# Patient Record
Sex: Male | Born: 1965 | Race: White | Hispanic: No | Marital: Married
Health system: Midwestern US, Community
[De-identification: ages and names within clinical notes are randomized; demographics above are authoritative.]

## PROBLEM LIST (undated history)

## (undated) DIAGNOSIS — Z87891 Personal history of nicotine dependence: Secondary | ICD-10-CM

## (undated) DIAGNOSIS — R569 Unspecified convulsions: Secondary | ICD-10-CM

## (undated) DIAGNOSIS — N289 Disorder of kidney and ureter, unspecified: Secondary | ICD-10-CM

## (undated) HISTORY — DX: Unspecified convulsions: R56.9

## (undated) HISTORY — DX: Personal history of nicotine dependence: Z87.891

## (undated) HISTORY — PX: JOINT REPLACEMENT: SHX530

---

## 2003-04-17 ENCOUNTER — Encounter: Admission: RE | Admit: 2003-04-17 | Discharge: 2003-04-17 | Payer: Self-pay | Admitting: Family Medicine

## 2003-04-17 ENCOUNTER — Encounter: Payer: Self-pay | Admitting: Family Medicine

## 2008-06-13 ENCOUNTER — Ambulatory Visit: Payer: Self-pay | Admitting: Family Medicine

## 2009-06-12 ENCOUNTER — Ambulatory Visit: Payer: Self-pay | Admitting: Family Medicine

## 2009-06-12 ENCOUNTER — Encounter: Admission: RE | Admit: 2009-06-12 | Discharge: 2009-06-12 | Payer: Self-pay | Admitting: Family Medicine

## 2010-01-01 ENCOUNTER — Ambulatory Visit: Payer: Self-pay | Admitting: Family Medicine

## 2010-01-29 ENCOUNTER — Ambulatory Visit: Payer: Self-pay | Admitting: Family Medicine

## 2010-02-27 ENCOUNTER — Ambulatory Visit: Payer: Self-pay | Admitting: Family Medicine

## 2010-03-16 ENCOUNTER — Ambulatory Visit: Payer: Self-pay | Admitting: Family Medicine

## 2010-09-11 ENCOUNTER — Ambulatory Visit (INDEPENDENT_AMBULATORY_CARE_PROVIDER_SITE_OTHER): Payer: 59 | Admitting: Family Medicine

## 2010-09-11 DIAGNOSIS — R51 Headache: Secondary | ICD-10-CM

## 2010-09-11 DIAGNOSIS — J0100 Acute maxillary sinusitis, unspecified: Secondary | ICD-10-CM

## 2010-09-29 ENCOUNTER — Institutional Professional Consult (permissible substitution) (INDEPENDENT_AMBULATORY_CARE_PROVIDER_SITE_OTHER): Payer: 59 | Admitting: Family Medicine

## 2010-09-29 DIAGNOSIS — Z7189 Other specified counseling: Secondary | ICD-10-CM

## 2010-10-30 ENCOUNTER — Institutional Professional Consult (permissible substitution) (INDEPENDENT_AMBULATORY_CARE_PROVIDER_SITE_OTHER): Payer: 59 | Admitting: Family Medicine

## 2010-10-30 DIAGNOSIS — F341 Dysthymic disorder: Secondary | ICD-10-CM

## 2010-10-30 DIAGNOSIS — Z7189 Other specified counseling: Secondary | ICD-10-CM

## 2011-02-19 ENCOUNTER — Ambulatory Visit (INDEPENDENT_AMBULATORY_CARE_PROVIDER_SITE_OTHER): Payer: 59 | Admitting: Medical

## 2011-02-19 ENCOUNTER — Encounter: Payer: Self-pay | Admitting: Medical

## 2011-02-19 VITALS — BP 110/70 | HR 57 | Wt 171.0 lb

## 2011-02-19 DIAGNOSIS — H60399 Other infective otitis externa, unspecified ear: Secondary | ICD-10-CM

## 2011-02-19 DIAGNOSIS — H6090 Unspecified otitis externa, unspecified ear: Secondary | ICD-10-CM

## 2011-02-19 DIAGNOSIS — M5430 Sciatica, unspecified side: Secondary | ICD-10-CM

## 2011-02-19 MED ORDER — NAPROXEN 500 MG PO TABS: 500.0000 mg | ORAL_TABLET | Freq: Two times a day (BID) | ORAL | Status: DC

## 2011-02-19 MED ORDER — TRAMADOL HCL 50 MG PO TABS: 50.0000 mg | ORAL_TABLET | Freq: Three times a day (TID) | ORAL | Status: DC | PRN

## 2011-02-19 MED ORDER — CIPROFLOXACIN-DEXAMETHASONE 0.3-0.1 % OT SUSP: 4.0000 [drp] | Freq: Two times a day (BID) | OTIC | Status: AC

## 2011-02-19 NOTE — Patient Instructions (Signed)
Lumbar Radiculopathy, Sciatica Sciatica is a weakness and/or changes in sensation (tingling, jolts, hot and cold, numbness) along the path the sciatic nerve travels. Irritation or damage to lumbar nerve roots is often also referred to as lumbar radiculopathy.  Lumbar radiculopathy (Sciatica) is the most common form of this problem. Radiculopathy can occur in any of the nerves coming out of the spinal cord. The problems caused depend on which nerves are involved. The sciatic nerve is the large nerve supplying the branches of nerves going from the hip to the toes. It often causes a numbness or weakness in the skin and/or muscles that the sciatic nerve serves. It also may cause symptoms (problems) of pain, burning, tingling, or electric shock-like feelings in the path of this nerve. This usually comes from injury to the fibers that make up the sciatic nerve. Some of these symptoms are low back pain and/or unpleasant feelings in the following areas:  From the mid-buttock down the back of the leg to the back of the knee.   And/or the outside of the calf and top of the foot.   And/or behind the inner ankle to the sole of the foot.  CAUSES  Herniated or slipped disc. Discs are the little cushions between the bones in the back.   Pressure by the piriformis muscle in the buttock on the sciatic nerve (Piriformis Syndrome).   Misalignment of the bones in the lower back and buttocks (Sacroiliac Joint Derangement).   Narrowing of the spinal canal that puts pressure on or pinches the fibers that make up the sciatic nerve.   A slipped vertebra that is out of line with those above or beneath it.   Abnormality of the nervous system itself so that nerve fibers do not transmit signals properly, especially to feet and calves (neuropathy).   Tumor (this is rare).  Your caregiver can usually determine the cause of your sciatica and begin the treatment most likely to help you. TREATMENT Taking over-the-counter  painkillers, physical therapy, rest, exercise, spinal manipulation, and injections of anesthetics and/or steroids may be used. Surgery, acupuncture, and Yoga can also be effective. Mind over matter techniques, mental imagery, and changing factors such as your bed, chair, desk height, posture, and activities are other treatments that may be helpful. You and your caregiver can help determine what is best for you. With proper diagnosis, the cause of most sciatica can be identified and removed. Communication and cooperation between your caregiver and you is essential. If you are not successful immediately, do not be discouraged. With time, a proper treatment can be found that will make you comfortable. HOME CARE INSTRUCTIONS  If the pain is coming from a problem in the back, applying ice to that area for 20 minutes, 3 times per day while awake, may be helpful. Put the ice in a plastic bag. Place a towel between the bag of ice and your skin.   You may exercise or perform your usual activities if these do not aggravate your pain, or as suggested by your caregiver.   Only take over-the-counter or prescription medicines for pain, discomfort, or fever as directed by your caregiver.   If your caregiver has given you a follow-up appointment, it is very important to keep that appointment. Not keeping the appointment could result in a chronic or permanent injury, pain, and disability. If there is any problem keeping the appointment, you must call back to this facility for assistance.  SEEK IMMEDIATE MEDICAL CARE IF:  You experience loss  of control of bowel or bladder.   You have increasing weakness in the trunk, buttocks, or legs.   There is numbness in any areas from the hip down to the toes.   You have difficulty walking or keeping your balance.   You have any of the above, with fever or forceful vomiting.  Document Released: 07/13/2001 Document Re-Released: 08/10/2009 [facility] Patient Information 2011  [place], [place].

## 2011-02-19 NOTE — Progress Notes (Signed)
  Subjective:   HPI  Colin Roth is a 45 y.o. male who presents for 2 problems - ears and back.   He notes that his ear canal is swollen, gets some draining, x 38mo.  Had some Doxycycline left over, and he used this for 10 days on 2 separate occasions.  The ears would clear up and then symptoms would start again.  Ears are painful.  He denies recent swimming.  He denies hx/o recurrent ear problems.  He notes some sciatic issues.  He reports pain in left lower back that goes down his leg, hurts after long car ride, and hurts worse at night.  Has to sleep propped up at an unusual angle to get relief.  Up and down ladder worsens.  During the middle of the day when he is most active helps.   Been having this problem for over a year.  Has seen Dr. Susann Givens for this in the past.  Was advised some PT, but he was unable to do this due to being out of town often with PT.  No other aggravating or relieving factors.  No other c/o.  The following portions of the patient's history were reviewed and updated as appropriate: allergies, current medications, past family history, past medical history, past social history, past surgical history and problem list.  Past Medical History  Diagnosis Date  . Migraine   . Diabetes mellitus     type I  . Former smoker     Review of Systems Constitutional: denies fever, chills, sweats, unexpected weight change, anorexia, fatigue Dermatology: denies rash ENT: +bilat ear drainage and pain; no runny nose, sore throat, hoarseness, sinus pain Cardiology: denies chest pain, palpitations, edema Respiratory: denies cough, shortness of breath, wheezing Gastroenterology: denies abdominal pain, nausea, vomiting, diarrhea, constipation, blood in stool Musculoskeletal: denies arthralgias, myalgias, joint swelling Urology: denies dysuria, difficulty urinating, hematuria, urinary frequency, urgency Neurology: no headache, weakness, tingling, numbness     Objective:   Physical Exam  General appearance: alert, no distress, WD/WN, white male, lean HEENT: normocephalic, sclerae anicteric, bilat ear canals with swelling and purulent debris, also significant cerumen on the left, nares patent, no discharge or erythema, pharynx normal Oral cavity: MMM, no lesions Neck: supple, shoddy anterior and post auricular lymphadenopathy, no thyromegaly, no masses Heart: RRR, normal S1, S2, no murmurs Lungs: CTA bilaterally, no wheezes, rhonchi, or rales Abdomen: +bs, soft, non tender, non distended, no masses, no hepatomegaly, no splenomegaly Back: tender left lumbar region and sciatic region, pain with left SLR above 30 degrees, ROM with some left lumbar pain, but full, otherwise back nontender Musculoskeletal: LE nontender, no swelling, no obvious deformity Extremities: no edema, no cyanosis, no clubbing Pulses: 2+ symmetrical Neurological: LE strength and sensation normal   Assessment :    Encounter Diagnoses  Name Primary?  . Otitis externa Yes  . Sciatica      Plan:  Otitis externa - Ciprodex drops.  If not improving by mid next week, we can call out Augmetin.  Avoid swimming until improved.  Sciatica - discussed symptoms, etiology, and treatment options.  We will refer to PT, he can use Naprosyn daily for now, and Ultram for breakthrough pain.  Rx sent.  Recheck in 24mo.

## 2011-03-18 ENCOUNTER — Encounter: Payer: Self-pay | Admitting: Family Medicine

## 2011-03-19 ENCOUNTER — Encounter: Payer: Self-pay | Admitting: Medical

## 2011-03-19 ENCOUNTER — Ambulatory Visit (INDEPENDENT_AMBULATORY_CARE_PROVIDER_SITE_OTHER): Payer: 59 | Admitting: Medical

## 2011-03-19 VITALS — BP 112/72 | HR 72 | Temp 98.6°F | Resp 20 | Ht 72.0 in | Wt 176.0 lb

## 2011-03-19 DIAGNOSIS — H60399 Other infective otitis externa, unspecified ear: Secondary | ICD-10-CM

## 2011-03-19 DIAGNOSIS — M549 Dorsalgia, unspecified: Secondary | ICD-10-CM

## 2011-03-19 DIAGNOSIS — M5430 Sciatica, unspecified side: Secondary | ICD-10-CM

## 2011-03-19 DIAGNOSIS — H6090 Unspecified otitis externa, unspecified ear: Secondary | ICD-10-CM

## 2011-03-19 MED ORDER — TIZANIDINE HCL 4 MG PO TABS: 4.0000 mg | ORAL_TABLET | Freq: Every day | ORAL | Status: DC

## 2011-03-19 NOTE — Progress Notes (Signed)
  Subjective:   HPI  Colin Roth is a 44 y.o. male who presents for recheck on ears and back.  I saw him 02/19/11 for concern for sciatica and otitis externa.  He notes that his ears feel fine, no additional pain or drainage.   He is here for recheck on back pain.  Since last visit he has used the Naprosyn daily, Ultram some, and has went to 1 physical therapy session.  He is out of town several days a week on business.  Is up on ladders often.  Works as Engineer, agricultural.  He reports pain in left lower back that goes down his leg, hurts after long car ride, and hurts worse at night.  Has to sleep propped up at an unusual angle to get relief.  Up and down ladder worsens.  During the middle of the day when he is most active helps.   Been having this problem for over a year.  No other aggravating or relieving factors.  No other c/o.  He has seen his endocrinologist recently in regular f/u.  She cautioned his use of the Naprosyn.   His diabetes was apparently improved.   The following portions of the patient's history were reviewed and updated as appropriate: allergies, current medications, past family history, past medical history, past social history, past surgical history and problem list.  Past Medical History  Diagnosis Date  . Migraine   . Diabetes mellitus     type I  . Former smoker     Review of Systems Constitutional: denies fever, chills, sweats, unexpected weight change, anorexia, fatigue Dermatology: denies rash ENT: no runny nose, ear pain, sore throat, hoarseness, sinus pain Cardiology: denies chest pain, palpitations, edema Respiratory: denies cough, shortness of breath, wheezing Gastroenterology: denies abdominal pain, nausea, vomiting, diarrhea, constipation, blood in stool Musculoskeletal: denies arthralgias, myalgias, joint swelling Urology: denies dysuria, difficulty urinating, hematuria, urinary frequency, urgency Neurology: no headache, weakness,  tingling, numbness     Objective:   Physical Exam  General appearance: alert, no distress, WD/WN, white male, lean HEENT: normocephalic, sclerae anicteric, TMs pearly, bilat ear canals clear, nares patent, no discharge or erythema, pharynx normal Oral cavity: MMM, no lesions Neck: supple, no lymphadenopathy, no thyromegaly, no masses Heart: RRR, normal S1, S2, no murmurs Lungs: CTA bilaterally, no wheezes, rhonchi, or rales Abdomen: +bs, soft, non tender, non distended, no masses, no hepatomegaly, no splenomegaly Back: tender left lumbar region and sciatic region, pain with left SLR above 30 degrees, ROM with some left lumbar pain, but full, otherwise back nontender Musculoskeletal: LE nontender, no swelling, no obvious deformity Extremities: no edema, no cyanosis, no clubbing Pulses: 2+ symmetrical Neurological: LE strength and sensation normal   Assessment :    Encounter Diagnoses  Name Primary?  . Sciatica   . Back pain   . Otitis externa Yes     Plan:  Sciatica and back pain -he has only been to one physical therapy session.  He has no new neurological findings today.  I advised he continue doing the stretches that physical therapy showed him, follow up with therapy for least the next 3-4 weeks as his schedule may only allow one visit per week, continue Naprosyn for the time being, Ultram for breakthrough pain, and added tizanidine when necessary use just at bedtime.  Naprosyn will be for short-term use given his diabetes.   Call report in 3-4 weeks.   Otitis externa - resolved

## 2011-04-26 ENCOUNTER — Ambulatory Visit (INDEPENDENT_AMBULATORY_CARE_PROVIDER_SITE_OTHER): Payer: 59 | Admitting: Family Medicine

## 2011-04-26 ENCOUNTER — Encounter: Payer: Self-pay | Admitting: Family Medicine

## 2011-04-26 VITALS — BP 112/76 | HR 80 | Ht 72.0 in | Wt 179.0 lb

## 2011-04-26 DIAGNOSIS — M461 Sacroiliitis, not elsewhere classified: Secondary | ICD-10-CM

## 2011-04-26 DIAGNOSIS — Z23 Encounter for immunization: Secondary | ICD-10-CM

## 2011-04-26 DIAGNOSIS — G43909 Migraine, unspecified, not intractable, without status migrainosus: Secondary | ICD-10-CM

## 2011-04-26 DIAGNOSIS — H60399 Other infective otitis externa, unspecified ear: Secondary | ICD-10-CM

## 2011-04-26 DIAGNOSIS — H6091 Unspecified otitis externa, right ear: Secondary | ICD-10-CM

## 2011-04-26 MED ORDER — RIZATRIPTAN BENZOATE 10 MG PO TABS: 10.0000 mg | ORAL_TABLET | ORAL | Status: DC | PRN

## 2011-04-26 MED ORDER — CIPROFLOXACIN HCL 500 MG PO TABS: 500.0000 mg | ORAL_TABLET | Freq: Two times a day (BID) | ORAL | Status: AC

## 2011-04-26 NOTE — Patient Instructions (Addendum)
Cipro for your her pain. If you continue to have difficulty, return here for further care. Use the Maxalt for the headache and if this does not clear up, call me Continue with her exercises. We will refer you to orthopedics for followup on your back pain. Continue to work on encouraging her son to move on with his life.

## 2011-04-26 NOTE — Progress Notes (Signed)
  Subjective:    Patient ID: Colin Roth, male    DOB: [birth year], 45 y.o.   MRN: [id number]  HPI He is here for recheck. He has continued to have intermittent earache. It does respond to his proximal but inevitably will come back. No fever, chills or sore throat. He has a history of migraine headaches and states that he has had 5 days in a row . He normally gets one headache per month and uses over-the-counter medications. He has used Maxalt in the past with good results. He has a year and a half history of left-sided low back pain with some radiation. He states that the pain gets better during the day when he's up and moving and loosened up. No numbness tingling or weakness. He has been doing stretching and using Naprosyn however the pain persists. He also continues to deal with home life stress dealing with his wife and her recent illnesses as well as his son and his inability to move on with his life.  Review of Systems Negative except as above    Objective:   Physical Exam alert and in no distress. Right TM is dull and vascular, left is normal, canals are normal. Throat is clear. Tonsils are normal. Neck is supple without adenopathy or thyromegaly. Cardiac exam shows a regular sinus rhythm without murmurs or gallops. Lungs are clear to auscultation.  Alert and in no distress. Some tenderness to palpation over the left SI joint. Figure 4 test is positive. Stork test negative. Negative straight leg raising with normal DTRs.      Assessment & Plan:   1. Right otitis externa    2. Migraine headache    3. Sacroiliitis  Ambulatory referral to Orthopedic Surgery, DG Lumbar Spine 2-3 Views   Maxalt given for the migraine. He was encouraged to call me if he is not clear up. He will be referred to orthopedics for sacroiliitis since he continues to have difficulty on responsive to conservative care. Cipro will be given for his otitis Flu shot given

## 2011-04-29 ENCOUNTER — Ambulatory Visit
Admission: RE | Admit: 2011-04-29 | Discharge: 2011-04-29 | Disposition: A | Discharge status: Home / Self Care | Payer: 59 | Source: Ambulatory Visit | Attending: Family Medicine | Admitting: Family Medicine

## 2011-04-29 DIAGNOSIS — M461 Sacroiliitis, not elsewhere classified: Secondary | ICD-10-CM

## 2011-05-17 ENCOUNTER — Ambulatory Visit
Admission: RE | Admit: 2011-05-17 | Discharge: 2011-05-17 | Disposition: A | Discharge status: Home / Self Care | Payer: 59 | Source: Ambulatory Visit | Attending: Cardiology | Admitting: Cardiology

## 2011-05-17 ENCOUNTER — Other Ambulatory Visit: Payer: Self-pay | Admitting: Cardiology

## 2011-05-17 DIAGNOSIS — R0602 Shortness of breath: Secondary | ICD-10-CM

## 2011-06-08 ENCOUNTER — Other Ambulatory Visit: Payer: Self-pay

## 2011-06-08 ENCOUNTER — Encounter (HOSPITAL_COMMUNITY): Payer: Self-pay | Admitting: *Deleted

## 2011-06-08 ENCOUNTER — Inpatient Hospital Stay (HOSPITAL_COMMUNITY)
Admission: EM | Admit: 2011-06-08 | Discharge: 2011-06-10 | DRG: 638 | Disposition: A | Discharge status: Home / Self Care | Payer: 59 | Attending: Internal Medicine | Admitting: Internal Medicine

## 2011-06-08 DIAGNOSIS — Z9119 Patient's noncompliance with other medical treatment and regimen: Secondary | ICD-10-CM

## 2011-06-08 DIAGNOSIS — N179 Acute kidney failure, unspecified: Secondary | ICD-10-CM | POA: Diagnosis present

## 2011-06-08 DIAGNOSIS — E1010 Type 1 diabetes mellitus with ketoacidosis without coma: Secondary | ICD-10-CM | POA: Diagnosis present

## 2011-06-08 DIAGNOSIS — Z87891 Personal history of nicotine dependence: Secondary | ICD-10-CM

## 2011-06-08 DIAGNOSIS — K219 Gastro-esophageal reflux disease without esophagitis: Secondary | ICD-10-CM | POA: Diagnosis present

## 2011-06-08 DIAGNOSIS — E860 Dehydration: Secondary | ICD-10-CM | POA: Diagnosis present

## 2011-06-08 DIAGNOSIS — E119 Type 2 diabetes mellitus without complications: Secondary | ICD-10-CM | POA: Diagnosis present

## 2011-06-08 DIAGNOSIS — D72829 Elevated white blood cell count, unspecified: Secondary | ICD-10-CM | POA: Diagnosis present

## 2011-06-08 DIAGNOSIS — Z794 Long term (current) use of insulin: Secondary | ICD-10-CM

## 2011-06-08 DIAGNOSIS — E1310 Other specified diabetes mellitus with ketoacidosis without coma: Principal | ICD-10-CM | POA: Diagnosis present

## 2011-06-08 DIAGNOSIS — Z96619 Presence of unspecified artificial shoulder joint: Secondary | ICD-10-CM

## 2011-06-08 DIAGNOSIS — Z91199 Patient's noncompliance with other medical treatment and regimen due to unspecified reason: Secondary | ICD-10-CM

## 2011-06-08 DIAGNOSIS — E1110 Type 2 diabetes mellitus with ketoacidosis without coma: Secondary | ICD-10-CM | POA: Diagnosis present

## 2011-06-08 DIAGNOSIS — Z79899 Other long term (current) drug therapy: Secondary | ICD-10-CM

## 2011-06-08 DIAGNOSIS — E109 Type 1 diabetes mellitus without complications: Secondary | ICD-10-CM | POA: Diagnosis present

## 2011-06-08 LAB — GLUCOSE, CAPILLARY
Glucose-Capillary: >600 mg/dL (ref 70–99)
Glucose-Capillary: >600 mg/dL (ref 70–99)
Glucose-Capillary: 440 mg/dL — ABNORMAL HIGH (ref 70–99)
Glucose-Capillary: 425 mg/dL — ABNORMAL HIGH (ref 70–99)
Glucose-Capillary: 203 mg/dL — ABNORMAL HIGH (ref 70–99)
Glucose-Capillary: 205 mg/dL — ABNORMAL HIGH (ref 70–99)

## 2011-06-08 LAB — DIFFERENTIAL
Basophils Relative: 0 % (ref 0–1)
Eosinophils Absolute: 0 10*3/uL (ref 0.0–0.7)
Lymphocytes Relative: 9 % — ABNORMAL LOW (ref 12–46)
Monocytes Absolute: 2.5 10*3/uL — ABNORMAL HIGH (ref 0.1–1.0)
Neutrophils Relative %: 80 % — ABNORMAL HIGH (ref 43–77)

## 2011-06-08 LAB — POCT I-STAT 3, VENOUS BLOOD GAS (G3P V)
Acid-base deficit: 23 mmol/L — ABNORMAL HIGH (ref 0.0–2.0)
O2 Saturation: 82 %
Patient temperature: 37
TCO2: 7 mmol/L (ref 0–100)
pCO2, Ven: 23.2 mmHg — ABNORMAL LOW (ref 45.0–50.0)

## 2011-06-08 LAB — POCT I-STAT, CHEM 8
Calcium, Ion: 1.08 mmol/L — ABNORMAL LOW (ref 1.12–1.32)
Glucose, Bld: >700 mg/dL (ref 70–99)
HCT: 54 % — ABNORMAL HIGH (ref 39.0–52.0)
Hemoglobin: 18.4 g/dL — ABNORMAL HIGH (ref 13.0–17.0)
TCO2: 6 mmol/L (ref 0–100)

## 2011-06-08 LAB — MAGNESIUM: Magnesium: 2 mg/dL (ref 1.5–2.5)

## 2011-06-08 LAB — BASIC METABOLIC PANEL
BUN: 32 mg/dL — ABNORMAL HIGH (ref 6–23)
CO2: 12 mEq/L — ABNORMAL LOW (ref 19–32)
CO2: 22 mEq/L (ref 19–32)
Calcium: 9.3 mg/dL (ref 8.4–10.5)
Calcium: 9.2 mg/dL (ref 8.4–10.5)
Chloride: 96 mEq/L (ref 96–112)
Creatinine, Ser: 1.3 mg/dL (ref 0.50–1.35)
GFR calc non Af Amer: 87 mL/min — ABNORMAL LOW (ref >90)
Glucose, Bld: 424 mg/dL — ABNORMAL HIGH (ref 70–99)
Glucose, Bld: 188 mg/dL — ABNORMAL HIGH (ref 70–99)
Sodium: 136 mEq/L (ref 135–145)

## 2011-06-08 LAB — CBC
HCT: 45.4 % (ref 39.0–52.0)
Hemoglobin: 17 g/dL (ref 13.0–17.0)
Hemoglobin: 16.4 g/dL (ref 13.0–17.0)
MCH: 32.5 pg (ref 26.0–34.0)
MCHC: 33.9 g/dL (ref 30.0–36.0)
MCV: 90.1 fL (ref 78.0–100.0)
Platelets: 284 10*3/uL (ref 150–400)
RBC: 5.21 MIL/uL (ref 4.22–5.81)
RBC: 5.04 MIL/uL (ref 4.22–5.81)

## 2011-06-08 LAB — CREATININE, SERUM: Creatinine, Ser: 1.42 mg/dL — ABNORMAL HIGH (ref 0.50–1.35)

## 2011-06-08 MED ORDER — POTASSIUM CHLORIDE 20 MEQ PO PACK
40.0000 meq | PACK | Freq: Once | ORAL | Status: DC
  Filled 2011-06-08: qty 2

## 2011-06-08 MED ORDER — POTASSIUM CHLORIDE 20 MEQ/15ML (10%) PO LIQD
40.0000 meq | Freq: Every day | ORAL | Status: DC
  Administered 2011-06-09 – 2011-06-10 (×2): 40 meq via ORAL
  Filled 2011-06-08 (×2): qty 30

## 2011-06-08 MED ORDER — SODIUM CHLORIDE 0.9 % IV BOLUS (SEPSIS)
2000.0000 mL | Freq: Once | INTRAVENOUS | Status: AC
  Administered 2011-06-08: 2000 mL via INTRAVENOUS

## 2011-06-08 MED ORDER — POTASSIUM CHLORIDE 10 MEQ/100ML IV SOLN
10.0000 meq | INTRAVENOUS | Status: AC
  Administered 2011-06-08: 10 meq via INTRAVENOUS

## 2011-06-08 MED ORDER — INSULIN ASPART 100 UNIT/ML ~~LOC~~ SOLN
3.0000 [IU] | Freq: Three times a day (TID) | SUBCUTANEOUS | Status: DC
  Filled 2011-06-08: qty 3

## 2011-06-08 MED ORDER — ALUM & MAG HYDROXIDE-SIMETH 200-200-20 MG/5ML PO SUSP
30.0000 mL | Freq: Four times a day (QID) | ORAL | Status: DC | PRN
  Administered 2011-06-09 – 2011-06-10 (×3): 30 mL via ORAL
  Filled 2011-06-08 (×3): qty 30

## 2011-06-08 MED ORDER — SODIUM CHLORIDE 0.9 % IV SOLN
INTRAVENOUS | Status: DC
  Administered 2011-06-08: 13:00:00 via INTRAVENOUS

## 2011-06-08 MED ORDER — OXYCODONE HCL 5 MG PO TABS
5.0000 mg | ORAL_TABLET | ORAL | Status: DC | PRN
  Administered 2011-06-08 – 2011-06-10 (×6): 5 mg via ORAL
  Filled 2011-06-08 (×6): qty 1

## 2011-06-08 MED ORDER — ENOXAPARIN SODIUM 40 MG/0.4ML ~~LOC~~ SOLN
40.0000 mg | SUBCUTANEOUS | Status: DC
  Administered 2011-06-10: 40 mg via SUBCUTANEOUS
  Filled 2011-06-08 (×2): qty 0.4

## 2011-06-08 MED ORDER — ACETAMINOPHEN 325 MG PO TABS: 650.0000 mg | ORAL_TABLET | Freq: Four times a day (QID) | ORAL | Status: DC | PRN

## 2011-06-08 MED ORDER — INSULIN GLARGINE 100 UNIT/ML ~~LOC~~ SOLN
55.0000 [IU] | Freq: Every day | SUBCUTANEOUS | Status: DC
  Administered 2011-06-08: 55 [IU] via SUBCUTANEOUS
  Filled 2011-06-08: qty 3

## 2011-06-08 MED ORDER — DEXTROSE-NACL 5-0.45 % IV SOLN
INTRAVENOUS | Status: DC
  Administered 2011-06-08: 15:00:00 via INTRAVENOUS

## 2011-06-08 MED ORDER — METOCLOPRAMIDE HCL 5 MG/ML IJ SOLN: 10.0000 mg | Freq: Once | INTRAMUSCULAR | Status: DC

## 2011-06-08 MED ORDER — SODIUM CHLORIDE 0.9 % IV SOLN
INTRAVENOUS | Status: DC
  Filled 2011-06-08: qty 1

## 2011-06-08 MED ORDER — ENOXAPARIN SODIUM 40 MG/0.4ML ~~LOC~~ SOLN
40.0000 mg | SUBCUTANEOUS | Status: DC
  Administered 2011-06-08 – 2011-06-09 (×2): 40 mg via SUBCUTANEOUS
  Filled 2011-06-08 (×3): qty 0.4

## 2011-06-08 MED ORDER — ENOXAPARIN SODIUM 40 MG/0.4ML ~~LOC~~ SOLN: 40.0000 mg | SUBCUTANEOUS | Status: DC

## 2011-06-08 MED ORDER — ACETAMINOPHEN 650 MG RE SUPP: 650.0000 mg | Freq: Four times a day (QID) | RECTAL | Status: DC | PRN

## 2011-06-08 MED ORDER — ONDANSETRON HCL 4 MG/2ML IJ SOLN: 4.0000 mg | Freq: Four times a day (QID) | INTRAMUSCULAR | Status: DC | PRN

## 2011-06-08 MED ORDER — SODIUM CHLORIDE 0.9 % IV SOLN: INTRAVENOUS | Status: DC

## 2011-06-08 MED ORDER — ZOLPIDEM TARTRATE 5 MG PO TABS: 10.0000 mg | ORAL_TABLET | Freq: Every evening | ORAL | Status: DC | PRN

## 2011-06-08 MED ORDER — SENNOSIDES-DOCUSATE SODIUM 8.6-50 MG PO TABS
1.0000 | ORAL_TABLET | Freq: Every day | ORAL | Status: DC | PRN
  Filled 2011-06-08: qty 1

## 2011-06-08 MED ORDER — ONDANSETRON HCL 4 MG/2ML IJ SOLN
4.0000 mg | Freq: Once | INTRAMUSCULAR | Status: AC
  Administered 2011-06-08: 4 mg via INTRAVENOUS
  Filled 2011-06-08: qty 2

## 2011-06-08 MED ORDER — DEXTROSE-NACL 5-0.45 % IV SOLN
INTRAVENOUS | Status: DC
  Administered 2011-06-08 (×2): via INTRAVENOUS

## 2011-06-08 MED ORDER — DEXTROSE 50 % IV SOLN: 25.0000 mL | INTRAVENOUS | Status: DC | PRN

## 2011-06-08 MED ORDER — ONDANSETRON HCL 4 MG PO TABS: 4.0000 mg | ORAL_TABLET | Freq: Four times a day (QID) | ORAL | Status: DC | PRN

## 2011-06-08 MED ORDER — VITAMIN B-1 100 MG PO TABS
100.0000 mg | ORAL_TABLET | Freq: Every day | ORAL | Status: DC
  Administered 2011-06-09 – 2011-06-10 (×2): 100 mg via ORAL
  Filled 2011-06-08 (×3): qty 1

## 2011-06-08 MED ORDER — SODIUM CHLORIDE 0.9 % IV SOLN
INTRAVENOUS | Status: DC
  Administered 2011-06-08: 5.4 [IU]/h via INTRAVENOUS
  Administered 2011-06-08: 10.8 [IU]/h via INTRAVENOUS
  Filled 2011-06-08: qty 1

## 2011-06-08 MED ORDER — ENOXAPARIN SODIUM 40 MG/0.4ML ~~LOC~~ SOLN
40.0000 mg | SUBCUTANEOUS | Status: DC
  Filled 2011-06-08: qty 0.4

## 2011-06-08 MED ORDER — INSULIN ASPART 100 UNIT/ML ~~LOC~~ SOLN: 0.0000 [IU] | Freq: Three times a day (TID) | SUBCUTANEOUS | Status: DC

## 2011-06-08 NOTE — ED Notes (Signed)
CBG performed and was 247 mg/dl.  Nurse notified.

## 2011-06-08 NOTE — ED Notes (Signed)
Family at bedside. 

## 2011-06-08 NOTE — ED Notes (Signed)
Dr Read Drivers at bedside.  Pt remains tachy 120-150-s. Nauseated, family at bedside.  Hydration inprogress

## 2011-06-08 NOTE — ED Notes (Signed)
Checked CBG on arrival, meter read HIGH, RN [employer] notified

## 2011-06-08 NOTE — ED Notes (Signed)
Report given to [place], RN on 3300.

## 2011-06-08 NOTE — ED Notes (Signed)
Pt c/o n/v since yesterday with increasing vomiting.  States vomited approx 30 times in past 24 hours.  Pt began to be confused and was brought to ED.  Pt tachypneic upon assessment with O2 sats 100% on RA.

## 2011-06-08 NOTE — ED Notes (Signed)
Patient spilled urine in bed while using a urinal.  Changed sheet, cleaned patient and new gown.

## 2011-06-08 NOTE — H&P (Signed)
Colin Roth MRN: [id number] DOB/AGE: 1965/12/01 45 y.o. Primary Care Physician:Colin Roth Colin Most, MD, MD Admit date: 06/08/2011 Chief Complaint: Short of breath and sick HPI: 45 year old man with type 1 diabetes mellitus, presenting to the emergency room with about 3 days worsening dyspnea nausea and vomiting. He has been having on-and-off shortness of breath for about couple weeks, and has had extensive cardiac investigations to look into possibility of unstable angina. Today finally his family brought into the emergency room he was found to be in diabetic ketoacidosis admitted to the hospital.   Past Medical History  Diagnosis Date  . Migraine   . Diabetes mellitus     type I  . Former smoker   . Asthma    Past Surgical History  Procedure Date  . Joint replacement     LEFT SHOULDER        Family History  Problem Relation Age of Onset  . Heart disease Maternal Grandfather   . Hypertension Paternal Grandmother     Social History:  reports that he quit smoking about 7 months ago. His smoking use included Cigarettes. He smoked 0 packs per day. He has never used smokeless tobacco. He reports that he drinks alcohol. He reports that he does not use illicit drugs.   Allergies: No Known Allergies  Medications Prior to Admission  Medication Dose Route Frequency Provider Last Rate Last Dose  . 0.9 %  sodium chloride infusion   Intravenous Continuous Colin Roth      . acetaminophen (TYLENOL) tablet 650 mg  650 mg Oral Q6H PRN Colin Roth       Or  . acetaminophen (TYLENOL) suppository 650 mg  650 mg Rectal Q6H PRN Colin Roth      . alum & mag hydroxide-simeth (MAALOX/MYLANTA) 200-200-20 MG/5ML suspension 30 mL  30 mL Oral Q6H PRN Colin Roth      . dextrose 5 %-0.45 % sodium chloride infusion   Intravenous Continuous Colin Roth      . dextrose 50 % solution 25 mL  25 mL Intravenous PRN Colin Roth      . enoxaparin (LOVENOX) injection 40 mg  40 mg Subcutaneous Q24H Colin P  Roth, PHARMD      . enoxaparin (LOVENOX) injection 40 mg  40 mg Subcutaneous To ER Colin Roth, PHARMD      . enoxaparin (LOVENOX) injection 40 mg  40 mg Subcutaneous Q24H Colin Roth      . insulin regular (HUMULIN R,NOVOLIN R) 1 Units/mL in sodium chloride 0.9 % 100 mL infusion   Intravenous Continuous Colin L Molpus, MD 15.2 mL/hr at 06/08/11 1005 15.2 Units/hr at 06/08/11 1005  . insulin regular (HUMULIN R,NOVOLIN R) 1 Units/mL in sodium chloride 0.9 % 100 mL infusion   Intravenous Continuous Colin Roth      . ondansetron (ZOFRAN) injection 4 mg  4 mg Intravenous Once Colin Beers Molpus, MD   4 mg at 06/08/11 0504  . ondansetron (ZOFRAN) tablet 4 mg  4 mg Oral Q6H PRN Colin Roth       Or  . ondansetron (ZOFRAN) injection 4 mg  4 mg Intravenous Q6H PRN Colin Roth      . oxyCODONE (Oxy IR/ROXICODONE) immediate release tablet 5 mg  5 mg Oral Q4H PRN Colin Roth      . potassium chloride 10 mEq in 100 mL IVPB  10 mEq Intravenous Q1H Colin Roth      . senna-docusate (Senokot-S) tablet 1 tablet  1 tablet Oral Daily  PRN Colin Roth      . sodium chloride 0.9 % bolus 2,000 mL  2,000 mL Intravenous Once Colin Beers Molpus, MD   2,000 mL at 06/08/11 0501  . vitamin B-1 tablet 100 mg  100 mg Oral Daily Colin Roth      . zolpidem (AMBIEN) tablet 10 mg  10 mg Oral QHS PRN Colin Roth      . DISCONTD: 0.9 %  sodium chloride infusion   Intravenous Continuous Colin Roth      . DISCONTD: enoxaparin (LOVENOX) injection 40 mg  40 mg Subcutaneous Q24H Colin Roth      . DISCONTD: metoCLOPramide (REGLAN) injection 10 mg  10 mg Intravenous Once Colin Seamen, MD       Medications Prior to Admission  Medication Sig Dispense Refill  . insulin glargine (LANTUS) 100 UNIT/ML injection Inject 55 Units into the skin at bedtime.       . insulin lispro (HUMALOG) 100 UNIT/ML injection Inject into the skin 3 (three) times daily before meals. Sliding scale       . metFORMIN (GLUCOPHAGE) 500 MG tablet Take 500 mg by mouth 2 (two) times  daily with a meal.             ZOX:WRUEA from the symptoms mentioned above,there are no other symptoms referable to all systems reviewed.  Physical Exam: Blood pressure 125/70, pulse 124, temperature 98.1 F (36.7 C), temperature source Oral, resp. rate 26, SpO2 98.00%. Patient is alert oriented in some mild distress. Anxious. Head normocephalic. Eyes pupils equal round react to light and accommodation. Heart tachycardic regular without murmurs lungs ago. Abdomen is soft, difuse tender, without rebound or guarding. Skin without any rashes. Neurological exam nonfocal.   Results for orders placed during the hospital encounter of 06/08/11 (from the past 48 hour(s))  GLUCOSE, CAPILLARY     Status: Abnormal   Collection Time   06/08/11  4:40 AM      Component Value Range Comment   Glucose-Capillary >600 (*) 70 - 99 (mg/dL)    Comment 1 Notify RN      Comment 2 Documented in Chart     CBC     Status: Abnormal   Collection Time   06/08/11  4:56 AM      Component Value Range Comment   WBC 23.1 (*) 4.0 - 10.5 (K/uL)    RBC 5.21  4.22 - 5.81 (MIL/uL)    Hemoglobin 17.0  13.0 - 17.0 (g/dL)    HCT 54.0  98.1 - 19.1 (%)    MCV 96.2  78.0 - 100.0 (fL)    MCH 32.6  26.0 - 34.0 (pg)    MCHC 33.9  30.0 - 36.0 (g/dL)    RDW 47.8  29.5 - 62.1 (%)    Platelets 284  150 - 400 (K/uL)   DIFFERENTIAL     Status: Abnormal   Collection Time   06/08/11  4:56 AM      Component Value Range Comment   Neutrophils Relative 80 (*) 43 - 77 (%)    Lymphocytes Relative 9 (*) 12 - 46 (%)    Monocytes Relative 11  3 - 12 (%)    Eosinophils Relative 0  0 - 5 (%)    Basophils Relative 0  0 - 1 (%)    Neutro Abs 18.5 (*) 1.7 - 7.7 (K/uL)    Lymphs Abs 2.1  0.7 - 4.0 (K/uL)    Monocytes Absolute 2.5 (*) 0.1 - 1.0 (  K/uL)    Eosinophils Absolute 0.0  0.0 - 0.7 (K/uL)    Basophils Absolute 0.0  0.0 - 0.1 (K/uL)    WBC Morphology MILD LEFT SHIFT (1-5% METAS, OCC MYELO, OCC BANDS)     GLUCOSE, CAPILLARY     Status:  Abnormal   Collection Time   06/08/11  6:24 AM      Component Value Range Comment   Glucose-Capillary >600 (*) 70 - 99 (mg/dL)    Comment 1 Documented in Chart      Comment 2 Notify RN     POCT I-STAT 3, BLOOD GAS (G3P V)     Status: Abnormal   Collection Time   06/08/11  6:47 AM      Component Value Range Comment   pH, Ven 7.030 (*) 7.250 - 7.300     pCO2, Ven 23.2 (*) 45.0 - 50.0 (mmHg)    pO2, Ven 66.0 (*) 30.0 - 45.0 (mmHg)    Bicarbonate 6.1 (*) 20.0 - 24.0 (mEq/L)    TCO2 7  0 - 100 (mmol/L)    O2 Saturation 82.0      Acid-base deficit 23.0 (*) 0.0 - 2.0 (mmol/L)    Patient temperature 37.0 C      Sample type VENOUS      Comment NOTIFIED PHYSICIAN     POCT I-STAT, CHEM 8     Status: Abnormal   Collection Time   06/08/11  6:50 AM      Component Value Range Comment   Sodium 126 (*) 135 - 145 (mEq/L)    Potassium 5.0  3.5 - 5.1 (mEq/L)    Chloride 95 (*) 96 - 112 (mEq/L)    BUN 40 (*) 6 - 23 (mg/dL)    Creatinine, Ser 1.61 (*) 0.50 - 1.35 (mg/dL)    Glucose, Bld >096 (*) 70 - 99 (mg/dL)    Calcium, Ion 0.45 (*) 1.12 - 1.32 (mmol/L)    TCO2 6  0 - 100 (mmol/L)    Hemoglobin 18.4 (*) 13.0 - 17.0 (g/dL)    HCT 40.9 (*) 81.1 - 52.0 (%)    Comment NOTIFIED PHYSICIAN     GLUCOSE, CAPILLARY     Status: Abnormal   Collection Time   06/08/11  7:45 AM      Component Value Range Comment   Glucose-Capillary >600 (*) 70 - 99 (mg/dL)   GLUCOSE, CAPILLARY     Status: Abnormal   Collection Time   06/08/11  8:51 AM      Component Value Range Comment   Glucose-Capillary 547 (*) 70 - 99 (mg/dL)    Comment 1 Notify RN     CBC     Status: Abnormal   Collection Time   06/08/11  9:18 AM      Component Value Range Comment   WBC 20.4 (*) 4.0 - 10.5 (K/uL)    RBC 5.04  4.22 - 5.81 (MIL/uL)    Hemoglobin 16.4  13.0 - 17.0 (g/dL)    HCT 91.4  78.2 - 95.6 (%)    MCV 90.1  78.0 - 100.0 (fL)    MCH 32.5  26.0 - 34.0 (pg)    MCHC 36.1 (*) 30.0 - 36.0 (g/dL)    RDW 21.3  08.6 - 57.8 (%)     Platelets 229  150 - 400 (K/uL)   CREATININE, SERUM     Status: Abnormal   Collection Time   06/08/11  9:18 AM      Component Value Range Comment  Creatinine, Ser 1.42 (*) 0.50 - 1.35 (mg/dL)    GFR calc non Af Amer 59 (*) >90 (mL/min)    GFR calc Af Amer 68 (*) >90 (mL/min)   GLUCOSE, CAPILLARY     Status: Abnormal   Collection Time   06/08/11 10:01 AM      Component Value Range Comment   Glucose-Capillary 440 (*) 70 - 99 (mg/dL)    No results found for this or any previous visit (from the past 240 hour(s)).  Dg Chest 2 View  05/17/2011  *RADIOLOGY REPORT*  Clinical Data: Shortness of breath. Former smoker.  CHEST - 2 VIEW  Comparison: None.  Findings: Cardiac and mediastinal contours appear normal.  The lungs appear clear.  No pleural effusion is identified.  IMPRESSION:  No significant abnormality identified.  Original Report Authenticated By: Dellia Cloud, M.D.   Impression: Diabetic ketoacidosis - due to noncompliant Principal Problem:  *DKA, type 1 Active Problems:  DM type 1, not at goal     Plan: Plan is to admit the patient to step down placement intravenous fluids and intravenous insulin. Start checking basic metabolic profile every 4 hours. Adjust insulin infusion rate and replace electrolytes as needed. Further plans of care will depend on he progresses. Patient also has acute renal failure due to dehydration related to the polyuria from uncontrolled diabetes. I suspect this will improve with IV fluids. The leukocytosis I suspect is related to diabetic ketoacidosis as the patient has no fever chills cough diarrhea or size of sepsis.       Aspynn Clover 06/08/2011, 10:42 AM

## 2011-06-08 NOTE — ED Notes (Signed)
Pt.bloodsugar is high over 600mg .

## 2011-06-08 NOTE — ED Provider Notes (Signed)
History     CSN: [id number] Arrival date & time: 06/08/2011  4:33 AM   First MD Initiated Contact with Patient 06/08/11 0454      Chief Complaint  Patient presents with  . Hyperglycemia    (Consider location/radiation/quality/duration/timing/severity/associated sxs/prior treatment) HPI This is a 45 year old white male with a history of type 1 diabetes. He developed nausea and vomiting 2 days ago which as persisted. He has had a polyuria, increased thirst (but unable to drink due to emesis) and shortness of breath. The symptoms are worsened and are now moderate to severe. He has not been able to take his regular medications nor has he checked his sugar when he was noted to be critical high in triage here. He was also noted to be profoundly tachycardic in triage. He states he has never been in DKA in the past.  Past Medical History  Diagnosis Date  . Migraine   . Diabetes mellitus     type I  . Former smoker   . Asthma     Past Surgical History  Procedure Date  . Joint replacement     LEFT SHOULDER    Family History  Problem Relation Age of Onset  . Heart disease Maternal Grandfather   . Hypertension Paternal Grandmother     History  Substance Use Topics  . Smoking status: Former Smoker -- 0.0 packs/day    Quit date: 11/01/2010  . Smokeless tobacco: Never Used  . Alcohol Use: Yes     occassional      Review of Systems  All other systems reviewed and are negative.    Allergies  Review of patient's allergies indicates no known allergies.  Home Medications   Current Outpatient Rx  Name Route Sig Dispense Refill  . INSULIN GLARGINE 100 UNIT/ML [place] SOLN Subcutaneous Inject 55 Units into the skin at bedtime.     . INSULIN LISPRO (HUMAN) 100 UNIT/ML [place] SOLN Subcutaneous Inject into the skin 3 (three) times daily before meals. Sliding scale     . METFORMIN HCL 500 MG PO TABS Oral Take 500 mg by mouth 2 (two) times daily with a meal.        BP 122/83  Pulse 122   Temp(Src) 97.3 F (36.3 C) (Axillary)  Resp 26  SpO2 100%  Physical Exam General: Well-developed, well-nourished male; somewhat lethargic; appearance consistent with age of record HENT: normocephalic, atraumatic; oral mucous membranes dry; breath smells of ketones Eyes: Normal appearance Neck: supple Heart: regular rate and rhythm; tachycardic Lungs: clear to auscultation bilaterally; no small respirations Abdomen: soft; nontender; nondistended; no masses or hepatosplenomegaly; bowel sounds present Extremities: No deformity; full range of motion Neurologic: Awake, alert;motor function intact in all extremities and symmetric; no facial droop Skin: Warm and dry     ED Course  CRITICAL CARE Performed by: Hanley Seamen Authorized by: Hanley Seamen Total critical care time: 30 minutes Critical care was necessary to treat or prevent imminent or life-threatening deterioration of the following conditions: endocrine crisis and metabolic crisis. Critical care was time spent personally by me on the following activities: development of treatment plan with patient or surrogate, discussions with consultants, evaluation of patient's response to treatment, examination of patient, obtaining history from patient or surrogate, ordering and performing treatments and interventions, ordering and review of laboratory studies and re-evaluation of patient's condition.   (including critical care time)    MDM   Nursing notes and vitals signs, including pulse oximetry, reviewed.  Summary of this  visit's results, reviewed by myself:  Labs:  Results for orders placed during the hospital encounter of 06/08/11  GLUCOSE, CAPILLARY      Component Value Range   Glucose-Capillary >600 (*) 70 - 99 (mg/dL)   Comment 1 Notify RN     Comment 2 Documented in Chart    CBC      Component Value Range   WBC 23.1 (*) 4.0 - 10.5 (K/uL)   RBC 5.21  4.22 - 5.81 (MIL/uL)   Hemoglobin 17.0  13.0 - 17.0 (g/dL)    HCT 19.1  47.8 - 29.5 (%)   MCV 96.2  78.0 - 100.0 (fL)   MCH 32.6  26.0 - 34.0 (pg)   MCHC 33.9  30.0 - 36.0 (g/dL)   RDW 62.1  30.8 - 65.7 (%)   Platelets 284  150 - 400 (K/uL)  DIFFERENTIAL      Component Value Range   Neutrophils Relative 80 (*) 43 - 77 (%)   Lymphocytes Relative 9 (*) 12 - 46 (%)   Monocytes Relative 11  3 - 12 (%)   Eosinophils Relative 0  0 - 5 (%)   Basophils Relative 0  0 - 1 (%)   Neutro Abs 18.5 (*) 1.7 - 7.7 (K/uL)   Lymphs Abs 2.1  0.7 - 4.0 (K/uL)   Monocytes Absolute 2.5 (*) 0.1 - 1.0 (K/uL)   Eosinophils Absolute 0.0  0.0 - 0.7 (K/uL)   Basophils Absolute 0.0  0.0 - 0.1 (K/uL)   WBC Morphology MILD LEFT SHIFT (1-5% METAS, OCC MYELO, OCC BANDS)    GLUCOSE, CAPILLARY      Component Value Range   Glucose-Capillary >600 (*) 70 - 99 (mg/dL)   Comment 1 Documented in Chart     Comment 2 Notify RN      6:44 AM Patient started on 2 L IV fluid bolus immediately after initial evaluation. Insulin drip her glucose stabilizer ordered.  I-STAT venous blood gas shows pH of 7.03 with the bicarbonate was 6.1; these are consistent with diabetic ketoacidosis.           Carlisle Beers Nason Conradt, MD 06/08/11 0700

## 2011-06-08 NOTE — ED Notes (Signed)
Obtained patients CBG it was 341 mg/dl

## 2011-06-08 NOTE — ED Notes (Signed)
Patient reports he developed shortness of breath, n/v and feeling bad on Saturday.  Patient noted to have labored/rapid resp in triage.  Patient states he has not been able to take his meds nor has he checked his sugar

## 2011-06-09 DIAGNOSIS — E860 Dehydration: Secondary | ICD-10-CM | POA: Diagnosis present

## 2011-06-09 DIAGNOSIS — E119 Type 2 diabetes mellitus without complications: Secondary | ICD-10-CM | POA: Diagnosis present

## 2011-06-09 DIAGNOSIS — N179 Acute kidney failure, unspecified: Secondary | ICD-10-CM | POA: Diagnosis present

## 2011-06-09 DIAGNOSIS — E1110 Type 2 diabetes mellitus with ketoacidosis without coma: Secondary | ICD-10-CM | POA: Diagnosis present

## 2011-06-09 DIAGNOSIS — D72829 Elevated white blood cell count, unspecified: Secondary | ICD-10-CM | POA: Diagnosis present

## 2011-06-09 LAB — GLUCOSE, CAPILLARY
Glucose-Capillary: 299 mg/dL — ABNORMAL HIGH (ref 70–99)
Glucose-Capillary: 319 mg/dL — ABNORMAL HIGH (ref 70–99)
Glucose-Capillary: 324 mg/dL — ABNORMAL HIGH (ref 70–99)
Glucose-Capillary: 345 mg/dL — ABNORMAL HIGH (ref 70–99)
Glucose-Capillary: 365 mg/dL — ABNORMAL HIGH (ref 70–99)

## 2011-06-09 LAB — BASIC METABOLIC PANEL
BUN: 21 mg/dL (ref 6–23)
Calcium: 9.2 mg/dL (ref 8.4–10.5)
Creatinine, Ser: 0.69 mg/dL (ref 0.50–1.35)
GFR calc Af Amer: >90 mL/min (ref >90)
GFR calc non Af Amer: >90 mL/min (ref >90)

## 2011-06-09 LAB — CBC
MCHC: 36.4 g/dL — ABNORMAL HIGH (ref 30.0–36.0)
RDW: 13 % (ref 11.5–15.5)

## 2011-06-09 LAB — MRSA PCR SCREENING: MRSA by PCR: POSITIVE — AB

## 2011-06-09 MED ORDER — INSULIN ASPART 100 UNIT/ML ~~LOC~~ SOLN
10.0000 [IU] | Freq: Three times a day (TID) | SUBCUTANEOUS | Status: DC
  Administered 2011-06-10: 10 [IU] via SUBCUTANEOUS

## 2011-06-09 MED ORDER — PANTOPRAZOLE SODIUM 40 MG PO TBEC: 40.0000 mg | DELAYED_RELEASE_TABLET | Freq: Every day | ORAL | Status: DC | PRN

## 2011-06-09 MED ORDER — INSULIN GLARGINE 100 UNIT/ML ~~LOC~~ SOLN
55.0000 [IU] | Freq: Every day | SUBCUTANEOUS | Status: DC
  Filled 2011-06-09: qty 3

## 2011-06-09 MED ORDER — INSULIN ASPART 100 UNIT/ML ~~LOC~~ SOLN: 0.0000 [IU] | Freq: Every day | SUBCUTANEOUS | Status: DC

## 2011-06-09 MED ORDER — SODIUM CHLORIDE 0.9 % IV SOLN
INTRAVENOUS | Status: DC
  Administered 2011-06-09: 01:00:00 via INTRAVENOUS

## 2011-06-09 MED ORDER — INSULIN ASPART 100 UNIT/ML ~~LOC~~ SOLN
0.0000 [IU] | Freq: Three times a day (TID) | SUBCUTANEOUS | Status: DC
  Administered 2011-06-09 – 2011-06-10 (×2): 7 [IU] via SUBCUTANEOUS

## 2011-06-09 MED ORDER — FAMOTIDINE 20 MG PO TABS
20.0000 mg | ORAL_TABLET | Freq: Two times a day (BID) | ORAL | Status: DC | PRN
  Filled 2011-06-09: qty 1

## 2011-06-09 MED ORDER — INSULIN ASPART 100 UNIT/ML ~~LOC~~ SOLN
3.0000 [IU] | Freq: Three times a day (TID) | SUBCUTANEOUS | Status: DC
  Administered 2011-06-09: 3 [IU] via SUBCUTANEOUS

## 2011-06-09 MED ORDER — INSULIN GLARGINE 100 UNIT/ML ~~LOC~~ SOLN: 65.0000 [IU] | Freq: Every day | SUBCUTANEOUS | Status: DC

## 2011-06-09 MED ORDER — INSULIN ASPART 100 UNIT/ML ~~LOC~~ SOLN: 0.0000 [IU] | Freq: Three times a day (TID) | SUBCUTANEOUS | Status: DC

## 2011-06-09 MED ORDER — INSULIN ASPART 100 UNIT/ML ~~LOC~~ SOLN
0.0000 [IU] | Freq: Three times a day (TID) | SUBCUTANEOUS | Status: DC
  Administered 2011-06-09: 8 [IU] via SUBCUTANEOUS
  Filled 2011-06-09: qty 3

## 2011-06-09 MED ORDER — ROSUVASTATIN CALCIUM 10 MG PO TABS
10.0000 mg | ORAL_TABLET | Freq: Every day | ORAL | Status: DC
  Administered 2011-06-10: 10 mg via ORAL
  Filled 2011-06-09 (×3): qty 1

## 2011-06-09 MED ORDER — INSULIN GLARGINE 100 UNIT/ML ~~LOC~~ SOLN
70.0000 [IU] | Freq: Every day | SUBCUTANEOUS | Status: DC
  Administered 2011-06-09: 70 [IU] via SUBCUTANEOUS
  Filled 2011-06-09: qty 3

## 2011-06-09 NOTE — Discharge Summary (Signed)
Patient ID: COLLYN RIBAS MRN: [id number] DOB/AGE: 03/29/1966 45 y.o.  Admit date: 06/08/2011 Discharge date: 06/09/2011  Primary Care Physician:  Carollee Herter, MD, MD  Discharge Diagnoses:   .DKA, type 2 .Leukocytosis .Acute renal failure .Dehydration .DM type 2, not at goal    Current Discharge Medication List    START taking these medications   Details  insulin aspart (NOVOLOG) 100 UNIT/ML injection Inject 0-20 Units into the skin 3 (three) times daily with meals. Qty: 1 vial, Refills: 1    pantoprazole (PROTONIX) 40 MG tablet Take 1 tablet (40 mg total) by mouth daily as needed (acid reflux). Qty: 30 tablet, Refills: 1      CONTINUE these medications which have CHANGED   Details  insulin glargine (LANTUS) 100 UNIT/ML injection Inject 65 Units into the skin at bedtime. Qty: 10 mL, Refills: 0      STOP taking these medications     insulin lispro (HUMALOG) 100 UNIT/ML injection      metFORMIN (GLUCOPHAGE) 500 MG tablet         Disposition and Follow-up: Follow up with Dr. Talmage Nap tomorrow as scheduled, follow up with Dr. Susann Givens next week.  Consults:  none   Significant Diagnostic Studies:  No results found.  Brief H and P: For complete details please refer to admission H and P, but in brief 45 year old man with type 1 diabetes mellitus, presenting to the emergency room with about 3 days worsening dyspnea nausea and vomiting. He has been having on-and-off shortness of breath for about couple weeks, and has had extensive cardiac investigations to look into possibility of unstable angina. Today finally his family brought into the emergency room he was found to be in diabetic ketoacidosis admitted to the hospital.    Physical Exam on Discharge:  Filed Vitals:   06/08/11 2000 06/09/11 0008 06/09/11 0500 06/09/11 0800  BP: 110/60 106/63 116/63 119/71  Pulse: 94 88 98 95  Temp: 98.1 F (36.7 C) 98.5 F (36.9 C) 98.8 F (37.1 C) 98.8 F (37.1 C)    TempSrc: Oral Oral Oral Oral  Resp: 14 14 14 27   Height:      Weight:      SpO2: 95% 98% 97% 100%     Intake/Output Summary (Last 24 hours) at 06/09/11 1248 Last data filed at 06/09/11 0800  Gross per 24 hour  Intake    480 ml  Output    275 ml  Net    205 ml    General: Alert, awake, oriented x3, in no acute distress. HEENT: No bruits, no goiter. Heart: Regular rate and rhythm, without murmurs, rubs, gallops. Lungs: Clear to auscultation bilaterally. Abdomen: Soft, nontender, nondistended, positive bowel sounds. Extremities: No clubbing cyanosis or edema with positive pedal pulses. Neuro: Grossly intact, nonfocal.  CBC:    Component Value Date/Time   WBC 12.3* 06/09/2011 0510   HGB 14.2 06/09/2011 0510   HCT 39.0 06/09/2011 0510   PLT 179 06/09/2011 0510   MCV 87.8 06/09/2011 0510   NEUTROABS 18.5* 06/08/2011 0456   LYMPHSABS 2.1 06/08/2011 0456   MONOABS 2.5* 06/08/2011 0456   EOSABS 0.0 06/08/2011 0456   BASOSABS 0.0 06/08/2011 0456    Basic Metabolic Panel:    Component Value Date/Time   NA 137 06/09/2011 0510   K 3.6 06/09/2011 0510   CL 101 06/09/2011 0510   CO2 20 06/09/2011 0510   BUN 21 06/09/2011 0510   CREATININE 0.69 06/09/2011 0510   GLUCOSE 291* 06/09/2011 0510  CALCIUM 9.2 06/09/2011 0510    Hospital Course:    DKA, type 2:  Patient was aggressively hydrated, and placed on IV insulin DKA protocol.  His Anion Gap has since closed and patient is feeling back to normal.  His nausea/vomitting has resolved and he is tolerating a po diet.  His blood sugars are still uncontrolled but he has follow up scheduled with his endocrinologist in am.  We will plan on discharge today and plan on follow up with Dr. Talmage Nap for further adjustment of insulin.  He was extensively counseled on importance of compliance with meds and checking blood sugars.   Leukocytosis:  Secondary to stress/hemoconcentration, improved with IVF   Acute renal failure:   Secondary to dehydration,  resolved with IVF   Dehydration:  Resolved with IVF   DM type 2, not at goal, increased lantus, continuing sliding scale at home, for further adjustment as outpatient.   Time spent on Discharge: 60  Signed: MEMON,JEHANZEB 06/09/2011, 12:48 PM

## 2011-06-09 NOTE — Progress Notes (Signed)
Spoke with pt about his DM control at home.  Left insulin at work according to pt.  Did not take any insulin and wound up in DKA.  Did not check CBGs more frequently while he had N/V.  Reminded pt about the importance of taking insulin to prevent both acute and long-term complications.  Got order for OP DM education.  Will follow,  MD made changes to insulin today.

## 2011-06-10 DIAGNOSIS — K219 Gastro-esophageal reflux disease without esophagitis: Secondary | ICD-10-CM | POA: Diagnosis present

## 2011-06-10 LAB — GLUCOSE, CAPILLARY

## 2011-06-10 MED ORDER — INSULIN ASPART 100 UNIT/ML ~~LOC~~ SOLN: 0.0000 [IU] | Freq: Three times a day (TID) | SUBCUTANEOUS | Status: DC

## 2011-06-10 MED ORDER — INSULIN GLARGINE 100 UNIT/ML ~~LOC~~ SOLN: 70.0000 [IU] | Freq: Every day | SUBCUTANEOUS | Status: DC

## 2011-06-10 MED ORDER — MUPIROCIN 2 % EX OINT
TOPICAL_OINTMENT | Freq: Two times a day (BID) | CUTANEOUS | Status: DC
  Administered 2011-06-10: 1 via NASAL
  Filled 2011-06-10: qty 22

## 2011-06-10 MED ORDER — MUPIROCIN 2 % EX OINT: TOPICAL_OINTMENT | Freq: Two times a day (BID) | CUTANEOUS | Status: AC

## 2011-06-10 MED ORDER — PANTOPRAZOLE SODIUM 40 MG PO TBEC: 40.0000 mg | DELAYED_RELEASE_TABLET | Freq: Every day | ORAL | Status: DC | PRN

## 2011-06-10 NOTE — Progress Notes (Signed)
Discharge instructions gone over with patient and spouse, follow up appointments to be made. Home medicines gone over. Prescriptions given, lantus insulin pen given.  S/s of hypoglycemia gone over. Patient told to stop taking nsaids and insulin lispro. Patient had all belongings.

## 2011-06-10 NOTE — Discharge Summary (Signed)
Addendum to D/C summary from 06/09/11:  Patient ID: Colin Roth MRN: [id number] DOB/AGE: 1966-03-23 45 y.o.  Admit date: 06/08/2011 Discharge date: 06/10/2011  Primary Care Physician:  Carollee Herter, MD, MD  Discharge Diagnoses:    Principal Problem:  *DKA, type 2 Active Problems:  Leukocytosis  Acute renal failure  Dehydration  DM type 2, not at goal  GERD (gastroesophageal reflux disease)   Current Discharge Medication List    START taking these medications   Details  insulin aspart (NOVOLOG) 100 UNIT/ML injection Inject 0-20 Units into the skin 3 (three) times daily with meals. Per sliding scale. Ok to substitute humalog Qty: 1 vial, Refills: 1    pantoprazole (PROTONIX) 40 MG tablet Take 1 tablet (40 mg total) by mouth daily as needed (acid reflux). Qty: 30 tablet, Refills: 1      CONTINUE these medications which have CHANGED   Details  insulin glargine (LANTUS) 100 UNIT/ML injection Inject 70 Units into the skin at bedtime. Qty: 10 mL, Refills: 1      CONTINUE these medications which have NOT CHANGED   Details  aspirin-acetaminophen-caffeine (EXCEDRIN MIGRAINE) 250-250-65 MG per tablet Take 1 tablet by mouth every 6 (six) hours as needed. For headaches      citalopram (CELEXA) 20 MG tablet Take 20 mg by mouth daily.      famotidine (PEPCID AC MAXIMUM STRENGTH) 20 MG tablet Take 20 mg by mouth 2 (two) times daily as needed. For acid reflux     rosuvastatin (CRESTOR) 10 MG tablet Take 10 mg by mouth daily.      metFORMIN (GLUCOPHAGE-XR) 500 MG 24 hr tablet Take 1,000 mg by mouth BID times 48H.      STOP taking these medications     ibuprofen (ADVIL,MOTRIN) 200 MG tablet      insulin lispro (HUMALOG) 100 UNIT/ML injection      insulin lispro (HUMALOG) 100 UNIT/ML injection      naproxen (NAPROSYN) 375 MG tablet      metFORMIN (GLUCOPHAGE) 500 MG tablet         Disposition and Follow-up: Dr. Susann Givens in 1 week, Dr. Talmage Nap today as  scheduled  Consults:  none  Significant Diagnostic Studies:  No results found.  Brief H and P: For complete details please refer to admission H and P, but in brief 45 year old man with type 1 diabetes mellitus, presenting to the emergency room with about 3 days worsening dyspnea nausea and vomiting. He has been having on-and-off shortness of breath for about couple weeks, and has had extensive cardiac investigations to look into possibility of unstable angina. Today finally his family brought into the emergency room he was found to be in diabetic ketoacidosis admitted to the hospital.   Physical Exam on Discharge:  Filed Vitals:   06/09/11 2100 06/09/11 2314 06/10/11 0244 06/10/11 0629  BP: 103/68 117/89 95/53 104/73  Pulse: 72 77 76 76  Temp: 98.2 F (36.8 C) 98 F (36.7 C) 98.2 F (36.8 C) 98.3 F (36.8 C)  TempSrc: Oral Oral Oral Oral  Resp: 18 16 16 16   Height:      Weight:      SpO2: 97% 99% 100% 100%    No intake or output data in the 24 hours ending 06/10/11 0904  General: Alert, awake, oriented x3, in no acute distress. HEENT: No bruits, no goiter. Heart: Regular rate and rhythm, without murmurs, rubs, gallops. Lungs: Clear to auscultation bilaterally. Abdomen: Soft, nontender, nondistended, positive bowel sounds. Extremities: No  clubbing cyanosis or edema with positive pedal pulses. Neuro: Grossly intact, nonfocal.  CBC:    Component Value Date/Time   WBC 12.3* 06/09/2011 0510   HGB 14.2 06/09/2011 0510   HCT 39.0 06/09/2011 0510   PLT 179 06/09/2011 0510   MCV 87.8 06/09/2011 0510   NEUTROABS 18.5* 06/08/2011 0456   LYMPHSABS 2.1 06/08/2011 0456   MONOABS 2.5* 06/08/2011 0456   EOSABS 0.0 06/08/2011 0456   BASOSABS 0.0 06/08/2011 0456    Basic Metabolic Panel:    Component Value Date/Time   NA 137 06/09/2011 0510   K 3.6 06/09/2011 0510   CL 101 06/09/2011 0510   CO2 20 06/09/2011 0510   BUN 21 06/09/2011 0510   CREATININE 0.69 06/09/2011 0510   GLUCOSE 291*  06/09/2011 0510   CALCIUM 9.2 06/09/2011 0510    Hospital Course:   DKA, type 2: Patient was aggressively hydrated, and placed on IV insulin DKA protocol. His Anion Gap has since closed and patient is feeling back to normal. His nausea/vomitting has resolved and he is tolerating a po diet. His blood sugars are still uncontrolled but he has follow up scheduled with his endocrinologist later today. Initial plan was for discharge yesterday, but since his blood sugars were running very high (near 400), the decision was made to observe the patient overnight and make further adjustments to insulin regimen.  His blood sugars are better today.  We will plan on discharge today and plan on follow up with Dr. Talmage Nap for further adjustment of insulin. He was extensively counseled on importance of compliance with meds and checking blood sugars.  He has been set up for outpatient diabetes education.   Leukocytosis: Secondary to stress/hemoconcentration, improved with IVF   Acute renal failure: Secondary to dehydration, resolved with IVF   Dehydration: Resolved with IVF   DM type 2, not at goal, increased lantus, continuing sliding scale at home, for further adjustment as outpatient.  GERD, Patient noted this since he started vomiting when he was in DKA.  It is improved with drinking milk.  Will give prescription for protonix.   Time spent on Discharge:  Signed: MEMON,JEHANZEB 06/10/2011, 9:04 AM

## 2011-06-10 NOTE — Plan of Care (Signed)
Problem: Consults Goal: Diagnosis-Diabetes Mellitus Outcome: Completed/Met Date Met:  06/10/11 Hyperglycemia

## 2011-06-10 NOTE — Progress Notes (Signed)
Patient for possible discharge today, outpt diabetes education ordered by MD for follow up.  Diabetes Education will follow up with patient.

## 2012-05-08 ENCOUNTER — Ambulatory Visit (INDEPENDENT_AMBULATORY_CARE_PROVIDER_SITE_OTHER): Payer: BC Managed Care – PPO | Admitting: Physician Assistant

## 2012-05-08 VITALS — BP 104/60 | HR 80 | Temp 98.1°F | Resp 18 | Ht 70.5 in | Wt 180.8 lb

## 2012-05-08 DIAGNOSIS — Z23 Encounter for immunization: Secondary | ICD-10-CM

## 2012-05-08 NOTE — Progress Notes (Signed)
  Subjective:    Patient ID: Colin Roth, male    DOB: 1965/10/09, 46 y.o.   MRN: [id number]  HPI  Needs influenza and TDap vaccines.  His sister has just had a baby, and he's not allowed to visit until he's had these vaccines.  Review of Systems    Past Medical History  Diagnosis Date  . Migraine   . Diabetes mellitus     type I  . Former smoker   . Asthma     Past Surgical History  Procedure Date  . Joint replacement     LEFT SHOULDER    Prior to Admission medications   Medication Sig Start Date End Date Taking? Authorizing Provider  insulin aspart (NOVOLOG) 100 UNIT/ML injection Inject 0-20 Units into the skin 3 (three) times daily with meals. Per sliding scale. Ok to substitute humalog 06/10/11 06/09/12 Yes Erick Blinks, MD  insulin glargine (LANTUS) 100 UNIT/ML injection Inject 70 Units into the skin at bedtime. 06/10/11 06/09/12 Yes Erick Blinks, MD    No Known Allergies  History   Social History  . Marital Status: Married    Spouse Name: Olegario Messier    Number of Children: 1  . Years of Education: 13   Occupational History  . Engineer, maintenance   Social History Main Topics  . Smoking status: Former Smoker -- 0.0 packs/day    Types: Cigarettes    Quit date: 11/01/2010  . Smokeless tobacco: Never Used  . Alcohol Use: Yes     occassional  . Drug Use: No  . Sexually Active: Yes -- Male partner(s)     Emergency planning/management officer with [employer]   Other Topics Concern  . Not on file   Social History Narrative  . No narrative on file    Family History  Problem Relation Age of Onset  . Heart disease Maternal Grandfather   . Hypertension Paternal Grandmother   . Thyroid disease Mother        Objective:   Physical Exam  Vitals reviewed. Constitutional: He is oriented to person, place, and time. Vital signs are normal. He appears well-developed and well-nourished. He is active and cooperative. He does not appear ill. No distress.  HENT:    Head: Normocephalic and atraumatic.  Eyes: Conjunctivae normal are normal.  Cardiovascular: Normal rate.   Pulmonary/Chest: Effort normal.  Neurological: He is alert and oriented to person, place, and time.  Skin: Skin is warm and dry.  Psychiatric: He has a normal mood and affect. His behavior is normal.      Assessment & Plan:   1. Need for influenza vaccination  Flu vaccine greater than or equal to 3yo preservative free IM  2. Need for Tdap vaccination  Tdap vaccine greater than or equal to 7yo IM

## 2013-12-19 LAB — METABOLIC PANEL, COMPREHENSIVE
A-G Ratio: 1.1 (ref 1.1–2.2)
ALT (SGPT): 20 U/L (ref 12–78)
AST (SGOT): 9 U/L — ABNORMAL LOW (ref 15–37)
Albumin: 3.6 g/dL (ref 3.5–5.0)
Alk. phosphatase: 61 U/L (ref 45–117)
Anion gap: 10 mmol/L (ref 5–15)
BUN/Creatinine ratio: 33 — ABNORMAL HIGH (ref 12–20)
BUN: 18 MG/DL (ref 6–20)
Bilirubin, total: 0.6 MG/DL (ref 0.2–1.0)
CO2: 25 mmol/L (ref 21–32)
Calcium: 8.6 MG/DL (ref 8.5–10.1)
Chloride: 104 mmol/L (ref 97–108)
Creatinine: 0.54 MG/DL (ref 0.45–1.15)
GFR est AA: >60 mL/min/{1.73_m2} (ref >60)
GFR est non-AA: >60 mL/min/{1.73_m2} (ref >60)
Globulin: 3.4 g/dL (ref 2.0–4.0)
Glucose: 277 mg/dL — ABNORMAL HIGH (ref 65–100)
Potassium: 4.8 mmol/L (ref 3.5–5.1)
Protein, total: 7 g/dL (ref 6.4–8.2)
Sodium: 139 mmol/L (ref 136–145)

## 2013-12-19 LAB — URINALYSIS W/ REFLEX CULTURE
Bacteria: NEGATIVE /hpf
Bilirubin: NEGATIVE
Blood: NEGATIVE
Glucose: >1000 mg/dL — AB
Ketone: 40 mg/dL — AB
Leukocyte Esterase: NEGATIVE
Nitrites: NEGATIVE
Protein: NEGATIVE mg/dL
Specific gravity: 1.02 (ref 1.003–1.030)
Urobilinogen: 0.2 EU/dL (ref 0.2–1.0)
pH (UA): 6 (ref 5.0–8.0)

## 2013-12-19 LAB — CBC WITH AUTOMATED DIFF
ABS. BASOPHILS: 0 10*3/uL (ref 0.0–0.1)
ABS. EOSINOPHILS: 0.1 10*3/uL (ref 0.0–0.4)
ABS. LYMPHOCYTES: 1.3 10*3/uL (ref 0.8–3.5)
ABS. MONOCYTES: 0.6 10*3/uL (ref 0.0–1.0)
ABS. NEUTROPHILS: 2.7 10*3/uL (ref 1.8–8.0)
BASOPHILS: 1 % (ref 0–1)
EOSINOPHILS: 2 % (ref 0–7)
HCT: 41.5 % (ref 36.6–50.3)
HGB: 14 g/dL (ref 12.1–17.0)
LYMPHOCYTES: 28 % (ref 12–49)
MCH: 30.6 PG (ref 26.0–34.0)
MCHC: 33.7 g/dL (ref 30.0–36.5)
MCV: 90.6 FL (ref 80.0–99.0)
MONOCYTES: 13 % (ref 5–13)
NEUTROPHILS: 56 % (ref 32–75)
PLATELET: 217 10*3/uL (ref 150–400)
RBC: 4.58 M/uL (ref 4.10–5.70)
RDW: 13.1 % (ref 11.5–14.5)
WBC: 4.7 10*3/uL (ref 4.1–11.1)

## 2013-12-19 MED ORDER — ONDANSETRON (PF) 4 MG/2 ML INJECTION
4 mg/2 mL | INTRAMUSCULAR | Status: AC
Start: 2013-12-19 — End: 2013-12-19

## 2013-12-19 MED ORDER — HYDROCODONE-ACETAMINOPHEN 5 MG-325 MG TAB
5-325 mg | ORAL_TABLET | ORAL | Status: AC | PRN
Start: 2013-12-19 — End: ?

## 2013-12-19 MED ADMIN — morphine 4 mg/mL injection: @ 10:00:00 | NDC 00409189101

## 2013-12-19 MED ADMIN — ondansetron (ZOFRAN) 4 mg/2 mL injection: INTRAVENOUS | @ 10:00:00 | NDC 00641607801

## 2013-12-19 MED ADMIN — sodium chloride 0.9 % bolus infusion 1,000 mL: INTRAVENOUS | @ 10:00:00 | NDC 00409798348

## 2013-12-19 MED FILL — MORPHINE 4 MG/ML SYRINGE: 4 mg/mL | INTRAMUSCULAR | Qty: 1

## 2013-12-19 MED FILL — ONDANSETRON (PF) 4 MG/2 ML INJECTION: 4 mg/2 mL | INTRAMUSCULAR | Qty: 2

## 2013-12-19 NOTE — ED Notes (Signed)
The documentation for this period is being entered following the guidelines as defined in the [facility] downtime policy by Randall HissShaunte D Williams-Stewart, RN. See Scanned documents.

## 2013-12-19 NOTE — Other (Signed)
Dr Sharlot GowdaGregg reviewed discharge instructions with the patient.  The patient verbalized understanding.  Alert and stable to walk at discharge.  Denies any pain at present.

## 2013-12-19 NOTE — ED Provider Notes (Signed)
HPI Comments: Victor Hodge is a 48 y.o WM who presents ambulatory to [facility] ED with cc of L flank pain x 2 days. Pt reports progressively worsening pain and states that his pain now radiates into his LLQ. He denies any hx of kidney stones or prior similar episodes of pain. Pt denies treating sx's with anything. Pt denies any prior abdominal surgeries. Pt states he is otherwise well and has no further complaints. He specifically denies any F/C, difficulty urinating, urinary frequency, dysuria, hematuria, nausea, vomit, diarrhea, constipation, HA, or rash.    PMHx: significant for diabetes    There are no other complaints, changes or physical findings at this time.   Written by Barbara Cower, ED Scribe, as dictated by Loni Beckwith, MD.        The history is provided by the patient.        No past medical history on file.     No past surgical history on file.      No family history on file.     History     Social History   ??? Marital Status: MARRIED     Spouse Name: N/A     Number of Children: N/A   ??? Years of Education: N/A     Occupational History   ??? Not on file.     Social History Main Topics   ??? Smoking status: Not on file   ??? Smokeless tobacco: Not on file   ??? Alcohol Use: Not on file   ??? Drug Use: Not on file   ??? Sexual Activity: Not on file     Other Topics Concern   ??? Not on file     Social History Narrative   ??? No narrative on file                  ALLERGIES: Review of patient's allergies indicates not on file.      Review of Systems   Constitutional: Negative.  Negative for fever and chills.   Eyes: Negative.    Respiratory: Negative.    Cardiovascular: Negative.    Gastrointestinal: Positive for abdominal pain. Negative for nausea, vomiting, diarrhea and constipation.   Endocrine: Negative.    Genitourinary: Positive for flank pain. Negative for dysuria, frequency, hematuria and difficulty urinating.   Musculoskeletal: Negative.    Skin: Negative.  Negative for rash.   Allergic/Immunologic: Negative.     Neurological: Negative.  Negative for headaches.   All other systems reviewed and are negative.      There were no vitals filed for this visit.         Physical Exam   Constitutional: He is oriented to person, place, and time.   HENT:   Head: Atraumatic.   Eyes: EOM are normal.   Cardiovascular: Normal rate, regular rhythm, normal heart sounds and intact distal pulses.  Exam reveals no gallop and no friction rub.    No murmur heard.  Pulmonary/Chest: Effort normal and breath sounds normal. No respiratory distress. He has no wheezes. He has no rales. He exhibits no tenderness.   Abdominal: Soft. Bowel sounds are normal. He exhibits no distension and no mass. There is tenderness in the left lower quadrant. There is no rebound and no guarding.   Left flank tender to palpation   Musculoskeletal: Normal range of motion. He exhibits no edema or tenderness.   Neurological: He is alert and oriented to person, place, and time.   Psychiatric: He has a  normal mood and affect.   Nursing note and vitals reviewed.  Written by Barbara Cower, ED Scribe, as dictated by Loni Beckwith, MD.       MDM  Number of Diagnoses or Management Options  Diagnosis management comments: DDx: ureteral colic, kidney stone, pyelonephritis, UTI, diverticulitis       Amount and/or Complexity of Data Reviewed  Clinical lab tests: ordered and reviewed  Tests in the radiology section of CPT??: ordered and reviewed  Tests in the medicine section of CPT??: ordered and reviewed        Procedures     7:15 AM  Pt has been reevaluated and reports he is feeling better and is completely pain free. He has been updated on all results and findings and is ready to be discharged home.  Written by Barbara Cower, ED Scribe, as dictated by Loni Beckwith, MD.      LABORATORY TESTS:  Recent Results (from the past 12 hour(s))   METABOLIC PANEL, COMPREHENSIVE    Collection Time     12/19/13  5:43 AM       Result Value Ref Range    Sodium 139  136 - 145 mmol/L    Potassium 4.8   3.5 - 5.1 mmol/L    Chloride 104  97 - 108 mmol/L    CO2 25  21 - 32 mmol/L    Anion gap 10  5 - 15 mmol/L    Glucose 277 (*) 65 - 100 mg/dL    BUN 18  6 - 20 MG/DL    Creatinine 0.54  0.45 - 1.15 MG/DL    BUN/Creatinine ratio 33 (*) 12 - 20      GFR est AA >60  >60 ml/min/1.29m    GFR est non-AA >60  >60 ml/min/1.76m   Calcium 8.6  8.5 - 10.1 MG/DL    Bilirubin, total 0.6  0.2 - 1.0 MG/DL    ALT 20  12 - 78 U/L    AST 9 (*) 15 - 37 U/L    Alk. phosphatase 61  45 - 117 U/L    Protein, total 7.0  6.4 - 8.2 g/dL    Albumin 3.6  3.5 - 5.0 g/dL    Globulin 3.4  2.0 - 4.0 g/dL    A-G Ratio 1.1  1.1 - 2.2     URINALYSIS W/ REFLEX CULTURE    Collection Time     12/19/13  5:43 AM       Result Value Ref Range    Color YELLOW/STRAW      Appearance CLEAR  CLEAR      Specific gravity 1.020  1.003 - 1.030      pH (UA) 6.0  5.0 - 8.0      Protein NEGATIVE   NEG mg/dL    Glucose >1000 (*) NEG mg/dL    Ketone 40 (*) NEG mg/dL    Bilirubin NEGATIVE   NEG      Blood NEGATIVE   NEG      Urobilinogen 0.2  0.2 - 1.0 EU/dL    Nitrites NEGATIVE   NEG      Leukocyte Esterase NEGATIVE   NEG      UA:UC IF INDICATED CULTURE NOT INDICATED BY UA RESULT  CNI      WBC 0-4  0 - 4 /hpf    RBC 0-5  0 - 5 /hpf    Epithelial cells FEW  FEW /lpf    Bacteria NEGATIVE  NEG /hpf    Hyaline Cast 0-2  0 - 2 /lpf       IMAGING RESULTS:   CT ABD PELV WO CONT (Final result) Result time: 12/19/13 06:55:04    Final result by Rad Results In Edi (12/19/13 06:55:04)    Narrative:    **Final Report**      ICD Codes / Adm.Diagnosis: / lower (l) side pain   Examination: CT ABD PELVIS WO CON - [id number] - Dec 19 2013 6:11AM  Accession No: [id number]  Reason: Abd Pain/KS      REPORT:  INDICATION: Abd Pain/KS     COMPARISON: None    TECHNIQUE: 5 mm axial images were obtained through the abdomen and pelvis   with no oral or intravenous contrast material. Multiplanar reconstructions   were generated.    FINDINGS:     The lung bases are clear. There is calcification in the  posterior right   hepatic lobe likely a granuloma. The gallbladder is normal. The spleen and   pancreas are normal. The adrenal glands are normal. The kidneys are normal   without calcification. There is no hydronephrosis. There is no free   intraperitoneal air or fluid. There is no bowel wall thickening or   dilatation. The appendix is normal.    There is a diverticulum from the posterior right urinary bladder. Prostate   and seminal last full enlarged. There are numerous pelvic phleboliths.   There is no pelvic lymphadenopathy. No significant pelvic free fluid. The   osseous structures are unremarkable.      IMPRESSION:    1. No nephrolithiasis or hydronephrosis.    2. Urinary bladder diverticulum.    3. Enlarged prostate and seminal vesicles, nonspecific.          Signing/Reading Doctor: Leonie Green LEWIS [phone])   Approved: Leonie Green LEWIS [id number]) Dec 19 2013 6:52AM          MEDICATIONS GIVEN:  Medications   morphine 4 mg/mL injection (not administered)   ondansetron (ZOFRAN) 4 mg/2 mL injection (not administered)       IMPRESSION:  1. Flank pain        PLAN:  1. Rx: Norco  2. F/U with PCP  3. Return to ED if worse     7:16 AM  Johnnye Sima  results have been reviewed with him.  He has been counseled regarding his diagnosis.  He verbally conveys understanding and agreement of the signs, symptoms, diagnosis, treatment and prognosis and additionally agrees to follow up as recommended with PCP in 24 - 48 hours.  He also agrees with the care-plan and conveys that all of his questions have been answered.  I have also put together some discharge instructions for him that include: 1) educational information regarding their diagnosis, 2) how to care for their diagnosis at home, as well a 3) list of reasons why they would want to return to the ED prior to their follow-up appointment, should their condition change.  Written by Barbara Cower, ED Scribe, as dictated by Loni Beckwith, MD.

## 2014-01-10 ENCOUNTER — Ambulatory Visit (INDEPENDENT_AMBULATORY_CARE_PROVIDER_SITE_OTHER): Payer: No Typology Code available for payment source | Admitting: Emergency Medicine

## 2014-01-10 VITALS — BP 110/62 | HR 86 | Temp 98.9°F | Resp 18 | Ht 70.75 in | Wt 188.6 lb

## 2014-01-10 DIAGNOSIS — H6123 Impacted cerumen, bilateral: Secondary | ICD-10-CM

## 2014-01-10 DIAGNOSIS — H6120 Impacted cerumen, unspecified ear: Secondary | ICD-10-CM

## 2014-01-10 DIAGNOSIS — H66019 Acute suppurative otitis media with spontaneous rupture of ear drum, unspecified ear: Secondary | ICD-10-CM

## 2014-01-10 MED ORDER — AMOXICILLIN-POT CLAVULANATE 875-125 MG PO TABS: 1.0000 | ORAL_TABLET | Freq: Two times a day (BID) | ORAL | Status: DC

## 2014-01-10 MED ORDER — OFLOXACIN 0.3 % OT SOLN: 10.0000 [drp] | Freq: Every day | OTIC | Status: DC

## 2014-01-10 MED ORDER — ACETAMINOPHEN-CODEINE #3 300-30 MG PO TABS: 1.0000 | ORAL_TABLET | ORAL | Status: DC | PRN

## 2014-01-10 NOTE — Progress Notes (Signed)
[facility] [address], [place] [address] [phone]  Date:  01/10/2014   Name:  Colin Roth   DOB:  [birth year]   MRN:  [id number]  PCP:  Carollee Herter, MD    Chief Complaint: Otalgia, Sore Throat and Cough   History of Present Illness:  Colin Roth is a 48 y.o. very pleasant male patient who presents with the following:  Ill since Monday with pain in right ear with purulent drainage, sore throat and a non productive cough.  No fever or chills.  No sick contacts.  No wheezing or shortness of breath.  No nausea or vomiting. No rash or stool change.  OBJECTIVE: Denies other complaint or health concern today.   Patient Active Problem List   Diagnosis Date Noted  . GERD (gastroesophageal reflux disease) 06/10/2011  . DKA, type 2 06/09/2011  . Leukocytosis 06/09/2011  . Acute renal failure 06/09/2011  . Dehydration 06/09/2011  . DM type 2, not at goal 06/09/2011    Past Medical History  Diagnosis Date  . Migraine   . Diabetes mellitus     type I  . Former smoker   . Asthma     Past Surgical History  Procedure Laterality Date  . Joint replacement      LEFT SHOULDER    History  Substance Use Topics  . Smoking status: Former Smoker -- 0.00 packs/day    Types: Cigarettes    Quit date: 11/01/2010  . Smokeless tobacco: Never Used  . Alcohol Use: Yes     Comment: occassional    Family History  Problem Relation Age of Onset  . Heart disease Maternal Grandfather   . Hypertension Paternal Grandmother   . Thyroid disease Mother     No Known Allergies  Medication list has been reviewed and updated.  Current Outpatient Prescriptions on File Prior to Visit  Medication Sig Dispense Refill  . insulin aspart (NOVOLOG) 100 UNIT/ML injection Inject 0-20 Units into the skin 3 (three) times daily with meals. Per sliding scale. Ok to substitute humalog  1 vial  1  . insulin glargine (LANTUS) 100 UNIT/ML injection Inject 70 Units  into the skin at bedtime.  10 mL  1   No current facility-administered medications on file prior to visit.    Review of Systems:  As per HPI, otherwise negative.    Physical Examination: Filed Vitals:   01/10/14 1510  BP: 110/62  Pulse: 86  Temp: 98.9 F (37.2 C)  Resp: 18   Filed Vitals:   01/10/14 1510  Height: 5' 10.75" (1.797 m)  Weight: 188 lb 9.6 oz (85.548 kg)   Body mass index is 26.49 kg/(m^2). Ideal Body Weight: Weight in (lb) to have BMI = 25: 177.6  GEN: WDWN, NAD, Non-toxic, A & O x 3 HEENT: Atraumatic, Normocephalic. Neck supple. No masses, No LAD. Ears and Nose: No external deformity.  Bilateral excess cerumen, purulent drainage on right.  Unable to visualize TM CV: RRR, No M/G/R. No JVD. No thrill. No extra heart sounds. PULM: CTA B, no wheezes, crackles, rhonchi. No retractions. No resp. distress. No accessory muscle use. ABD: S, NT, ND, +BS. No rebound. No HSM. EXTR: No c/c/e NEURO Normal gait.  PSYCH: Normally interactive. Conversant. Not depressed or anxious appearing.  Calm demeanor.    Assessment and Plan: Ruptured right TM augmentin floxin ENT  Signed,  Phillips Odor, MD

## 2014-01-10 NOTE — Addendum Note (Signed)
Addended by: Carmelina Dane on: 01/10/2014 03:56 PM   Modules accepted: Orders

## 2014-01-10 NOTE — Patient Instructions (Signed)
Otitis Media With Effusion Otitis media with effusion is the presence of fluid in the middle ear. This is a common problem in children, which often follows ear infections. It may be present for weeks or longer after the infection. Unlike an acute ear infection, otitis media with effusion refers only to fluid behind the ear drum and not infection. Children with repeated ear and sinus infections and allergy problems are the most likely to get otitis media with effusion. CAUSES  The most frequent cause of the fluid buildup is dysfunction of the eustachian tubes. These are the tubes that drain fluid in the ears to the to the back of the nose (nasopharynx). SYMPTOMS   The main symptom of this condition is hearing loss. As a result, you or your child may:  Listen to the TV at a loud volume.  Not respond to questions.  Ask "what" often when spoken to.  Mistake or confuse on sound or word for another.  There may be a sensation of fullness or pressure but usually not pain. DIAGNOSIS   Your health care provider will diagnose this condition by examining you or your child's ears.  Your health care provider may test the pressure in you or your child's ear with a tympanometer.  A hearing test may be conducted if the problem persists. TREATMENT   Treatment depends on the duration and the effects of the effusion.  Antibiotics, decongestants, nose drops, and cortisone-type drugs (tablets or nasal spray) may not be helpful.  Children with persistent ear effusions may have delayed language or behavioral problems. Children at risk for developmental delays in hearing, learning, and speech may require referral to a specialist earlier than children not at risk.  You or your child's health care provider may suggest a referral to an ear, nose, and throat surgeon for treatment. The following may help restore normal hearing:  Drainage of fluid.  Placement of ear tubes (tympanostomy tubes).  Removal of  adenoids (adenoidectomy). HOME CARE INSTRUCTIONS   Avoid second hand smoke.  Infants who are breast fed are less likely to have this condition.  Avoid feeding infants while laying flat.  Avoid known environmental allergens.  Avoid people who are sick. SEEK MEDICAL CARE IF:   Hearing is not better in 3 months.  Hearing is worse.  Ear pain.  Drainage from the ear.  Dizziness. MAKE SURE YOU:   Understand these instructions.  Will watch your condition.  Will get help right away if you are not doing well or get worse. Document Released: 08/26/2004 Document Revised: 05/09/2013 Document Reviewed: 02/13/2013 [employer] Patient Information 2014 [employer].  

## 2015-03-03 ENCOUNTER — Emergency Department (HOSPITAL_COMMUNITY): Payer: Managed Care, Other (non HMO)

## 2015-03-03 ENCOUNTER — Encounter (HOSPITAL_COMMUNITY): Payer: Self-pay | Admitting: *Deleted

## 2015-03-03 ENCOUNTER — Emergency Department (HOSPITAL_COMMUNITY)
Admission: EM | Admit: 2015-03-03 | Discharge: 2015-03-04 | Disposition: A | Discharge status: Home / Self Care | Payer: Managed Care, Other (non HMO) | Attending: Emergency Medicine | Admitting: Emergency Medicine

## 2015-03-03 DIAGNOSIS — R1032 Left lower quadrant pain: Secondary | ICD-10-CM | POA: Insufficient documentation

## 2015-03-03 DIAGNOSIS — Z792 Long term (current) use of antibiotics: Secondary | ICD-10-CM | POA: Diagnosis not present

## 2015-03-03 DIAGNOSIS — Z794 Long term (current) use of insulin: Secondary | ICD-10-CM | POA: Diagnosis not present

## 2015-03-03 DIAGNOSIS — Z87442 Personal history of urinary calculi: Secondary | ICD-10-CM | POA: Diagnosis not present

## 2015-03-03 DIAGNOSIS — Z8679 Personal history of other diseases of the circulatory system: Secondary | ICD-10-CM | POA: Insufficient documentation

## 2015-03-03 DIAGNOSIS — Z87891 Personal history of nicotine dependence: Secondary | ICD-10-CM | POA: Insufficient documentation

## 2015-03-03 DIAGNOSIS — R109 Unspecified abdominal pain: Secondary | ICD-10-CM

## 2015-03-03 DIAGNOSIS — J45909 Unspecified asthma, uncomplicated: Secondary | ICD-10-CM | POA: Diagnosis not present

## 2015-03-03 DIAGNOSIS — E109 Type 1 diabetes mellitus without complications: Secondary | ICD-10-CM | POA: Diagnosis not present

## 2015-03-03 HISTORY — DX: Disorder of kidney and ureter, unspecified: N28.9

## 2015-03-03 LAB — CBC
HEMATOCRIT: 43.1 % (ref 39.0–52.0)
HEMOGLOBIN: 14.3 g/dL (ref 13.0–17.0)
MCH: 30.5 pg (ref 26.0–34.0)
MCHC: 33.2 g/dL (ref 30.0–36.0)
MCV: 91.9 fL (ref 78.0–100.0)
PLATELETS: 209 10*3/uL (ref 150–400)
RBC: 4.69 MIL/uL (ref 4.22–5.81)
RDW: 13.4 % (ref 11.5–15.5)
WBC: 8.2 10*3/uL (ref 4.0–10.5)

## 2015-03-03 LAB — COMPREHENSIVE METABOLIC PANEL
ALBUMIN: 4 g/dL (ref 3.5–5.0)
ALT: 15 U/L — AB (ref 17–63)
ANION GAP: 7 (ref 5–15)
AST: 18 U/L (ref 15–41)
Alkaline Phosphatase: 55 U/L (ref 38–126)
BILIRUBIN TOTAL: 0.5 mg/dL (ref 0.3–1.2)
BUN: 12 mg/dL (ref 6–20)
CO2: 28 mmol/L (ref 22–32)
CREATININE: 0.82 mg/dL (ref 0.61–1.24)
Calcium: 9.4 mg/dL (ref 8.9–10.3)
Chloride: 105 mmol/L (ref 101–111)
GFR calc Af Amer: >60 mL/min (ref >60)
Glucose, Bld: 55 mg/dL — ABNORMAL LOW (ref 65–99)
POTASSIUM: 4.1 mmol/L (ref 3.5–5.1)
SODIUM: 140 mmol/L (ref 135–145)
TOTAL PROTEIN: 7.4 g/dL (ref 6.5–8.1)

## 2015-03-03 LAB — URINALYSIS, ROUTINE W REFLEX MICROSCOPIC
Bilirubin Urine: NEGATIVE
Glucose, UA: NEGATIVE mg/dL
HGB URINE DIPSTICK: NEGATIVE
Ketones, ur: NEGATIVE mg/dL
LEUKOCYTES UA: NEGATIVE
Nitrite: NEGATIVE
Protein, ur: NEGATIVE mg/dL
Specific Gravity, Urine: 1.006 (ref 1.005–1.030)
Urobilinogen, UA: 0.2 mg/dL (ref 0.0–1.0)
pH: 6.5 (ref 5.0–8.0)

## 2015-03-03 LAB — LIPASE, BLOOD: Lipase: 18 U/L — ABNORMAL LOW (ref 22–51)

## 2015-03-03 MED ORDER — HYDROCODONE-ACETAMINOPHEN 5-325 MG PO TABS
1.0000 | ORAL_TABLET | ORAL | Status: DC | PRN
Start: 2015-03-03 — End: 2015-05-02

## 2015-03-03 MED ORDER — OXYCODONE-ACETAMINOPHEN 5-325 MG PO TABS
2.0000 | ORAL_TABLET | Freq: Once | ORAL | Status: AC
  Administered 2015-03-03: 2 via ORAL
  Filled 2015-03-03: qty 2

## 2015-03-03 NOTE — Discharge Instructions (Signed)
Take pain medication as prescribed.  Do not drive or operate heavy machinery for 4-6 hours after taking medication. °

## 2015-03-03 NOTE — ED Provider Notes (Signed)
CSN: [id number]     Arrival date & time 03/03/15  1928 History   First MD Initiated Contact with Patient 03/03/15 2129     Chief Complaint  Patient presents with  . Abdominal Pain     (Consider location/radiation/quality/duration/timing/severity/associated sxs/prior Treatment) HPI Comments: Patient with a history of Nephrolithiasis presents today with LLQ abdominal pain.  He reports that he began having the pain last evening.  Pain was initially located left lateral flank area, but has since moved to the LLQ of the abdomen.  He reports that the pain has been intermittent and fluctuates in intensity.  Pain is controlled at this time.  However, he reports earlier today the pain was significant.  He reports that that the pain is similar to the pain that he has had with a kidney stone in the past.  He has not taken anything for pain today.  He denies fever, chills, nausea, vomiting, diarrhea, or constipation.  No hematuria or other urinary symptoms.    Patient is a 49 y.o. male presenting with abdominal pain. The history is provided by the patient.  Abdominal Pain   Past Medical History  Diagnosis Date  . Migraine   . Diabetes mellitus     type I  . Former smoker   . Asthma   . Renal disorder     kidney stones   Past Surgical History  Procedure Laterality Date  . Joint replacement      LEFT SHOULDER   Family History  Problem Relation Age of Onset  . Heart disease Maternal Grandfather   . Hypertension Paternal Grandmother   . Thyroid disease Mother    History  Substance Use Topics  . Smoking status: Former Smoker -- 0.00 packs/day    Types: Cigarettes    Quit date: 11/01/2010  . Smokeless tobacco: Never Used  . Alcohol Use: Yes     Comment: occassional    Review of Systems  Gastrointestinal: Positive for abdominal pain.  All other systems reviewed and are negative.     Allergies  Review of patient's allergies indicates no known allergies.  Home Medications   Prior  to Admission medications   Medication Sig Start Date End Date Taking? Authorizing Provider  insulin lispro (HUMALOG KWIKPEN) 100 UNIT/ML KiwkPen Inject 10-16 Units into the skin 2 (two) times daily.  10/08/14  Yes Historical Provider, MD  acetaminophen-codeine (TYLENOL #3) 300-30 MG per tablet Take 1-2 tablets by mouth every 4 (four) hours as needed. Patient not taking: Reported on 03/03/2015 01/10/14   Carmelina Dane, MD  amoxicillin-clavulanate (AUGMENTIN) 875-125 MG per tablet Take 1 tablet by mouth 2 (two) times daily. 01/10/14   Carmelina Dane, MD  HUMALOG KWIKPEN 100 UNIT/ML KiwkPen INJECT 15 UNITS INTO THE SKIN 3 TIMES A DAY 02/12/15   Historical Provider, MD  insulin aspart (NOVOLOG) 100 UNIT/ML injection Inject 0-20 Units into the skin 3 (three) times daily with meals. Per sliding scale. Ok to substitute humalog 06/10/11 06/09/12  Erick Blinks, MD  insulin glargine (LANTUS) 100 UNIT/ML injection Inject 70 Units into the skin at bedtime. 06/10/11 06/09/12  Erick Blinks, MD  ofloxacin (FLOXIN) 0.3 % otic solution Place 10 drops into the right ear daily. 01/10/14   Carmelina Dane, MD   BP 142/79 mmHg  Pulse 78  Temp(Src) 97.8 F (36.6 C) (Oral)  Resp 16  Ht 6' (1.829 m)  Wt 190 lb (86.183 kg)  BMI 25.76 kg/m2  SpO2 98% Physical Exam  Constitutional: He appears  well-developed and well-nourished.  HENT:  Head: Normocephalic and atraumatic.  Mouth/Throat: Oropharynx is clear and moist.  Neck: Normal range of motion. Neck supple.  Cardiovascular: Normal rate, regular rhythm and normal heart sounds.   Pulmonary/Chest: Effort normal and breath sounds normal.  Abdominal: Soft. Bowel sounds are normal. He exhibits no distension and no mass. There is tenderness. There is no rebound and no guarding.  Mild tenderness to palpation of the left lower abdomen  Musculoskeletal: Normal range of motion.  Neurological: He is alert.  Skin: Skin is warm and dry.  Psychiatric: He has a normal  mood and affect.  Nursing note and vitals reviewed.   ED Course  Procedures (including critical care time) Labs Review Labs Reviewed  LIPASE, BLOOD - Abnormal; Notable for the following:    Lipase 18 (*)    All other components within normal limits  COMPREHENSIVE METABOLIC PANEL - Abnormal; Notable for the following:    Glucose, Bld 55 (*)    ALT 15 (*)    All other components within normal limits  CBC  URINALYSIS, ROUTINE W REFLEX MICROSCOPIC (NOT AT [facility])    Imaging Review Ct Renal Stone Study  03/03/2015   CLINICAL DATA:  Left-sided abdominal pain, onset last night.  EXAM: CT ABDOMEN AND PELVIS WITHOUT CONTRAST  TECHNIQUE: Multidetector CT imaging of the abdomen and pelvis was performed following the standard protocol without IV contrast.  COMPARISON:  None.  FINDINGS: There are no urinary calculi. There is no hydronephrosis or ureteral dilatation. There are unremarkable unenhanced appearances of the liver, spleen, pancreas, adrenals and kidneys. There are normal appearances of the stomach, small bowel and colon. The appendix is normal. The abdominal aorta is normal in caliber. There is mild atherosclerotic calcification. There is no adenopathy in the abdomen or pelvis. No acute inflammatory changes are evident in the abdomen or pelvis. There is no significant abnormality in the lower chest. There is no significant musculoskeletal abnormality  IMPRESSION: No significant abnormality.   Electronically Signed   By: Ellery Plunk M.D.   On: 03/03/2015 23:26     EKG Interpretation None      MDM   Final diagnoses:  Left flank pain   Patient presents today with complaint of LLQ abdominal pain.  He reports that initially the pain was in the left lower back, but then moved to the abdomen.  Pain has been intermittent.  History consistent with renal colic.  Labs unremarkable.  UA negative.  CT renal stone study is also negative.  Pain improved while in the ED.  Feel that the patient is  stable for discharge.  Return precautions given.    Santiago Glad, PA-C 03/04/15 0011  Melene Plan, DO 03/04/15 2306

## 2015-03-03 NOTE — ED Notes (Signed)
Patient transported to CT 

## 2015-03-03 NOTE — ED Notes (Signed)
Pt states that he began to have left sided abd pain that began last pm; pt states that it feels similar to prev kidney stone; pt states that he was unable to rest last night due to the pain and that pain has been persistent today; pt c/o urinary frequency; pt denies N/V

## 2015-05-02 ENCOUNTER — Other Ambulatory Visit: Payer: Self-pay | Admitting: Family Medicine

## 2015-05-02 ENCOUNTER — Encounter: Payer: Self-pay | Admitting: Family Medicine

## 2015-05-02 ENCOUNTER — Ambulatory Visit (INDEPENDENT_AMBULATORY_CARE_PROVIDER_SITE_OTHER): Payer: Managed Care, Other (non HMO) | Admitting: Family Medicine

## 2015-05-02 ENCOUNTER — Ambulatory Visit
Admission: RE | Admit: 2015-05-02 | Discharge: 2015-05-02 | Disposition: A | Discharge status: Home / Self Care | Payer: Managed Care, Other (non HMO) | Source: Ambulatory Visit | Attending: Family Medicine | Admitting: Family Medicine

## 2015-05-02 VITALS — BP 106/66 | HR 81 | Ht 72.0 in | Wt 204.0 lb

## 2015-05-02 DIAGNOSIS — R0781 Pleurodynia: Secondary | ICD-10-CM

## 2015-05-02 DIAGNOSIS — M546 Pain in thoracic spine: Secondary | ICD-10-CM

## 2015-05-02 DIAGNOSIS — M549 Dorsalgia, unspecified: Secondary | ICD-10-CM

## 2015-05-02 DIAGNOSIS — IMO0002 Reserved for concepts with insufficient information to code with codable children: Secondary | ICD-10-CM

## 2015-05-02 DIAGNOSIS — M6749 Ganglion, multiple sites: Secondary | ICD-10-CM

## 2015-05-02 NOTE — Addendum Note (Signed)
Addended by: Ronnald Nian on: 05/02/2015 04:07 PM   Modules accepted: Orders

## 2015-05-02 NOTE — Progress Notes (Signed)
   Subjective:    Patient ID: Colin Roth, male    DOB: 12-14-1965, 49 y.o.   MRN: [id number]  HPI    Review of Systems     Objective:   Physical Exam        Assessment & Plan:   the previous x-rays were really negative. I will go ahead and get a chest x-ray and see if that gives any more information.

## 2015-05-02 NOTE — Progress Notes (Signed)
   Subjective:    Patient ID: Colin Roth, male    DOB: 1966-01-28, 49 y.o.   MRN: [id number]  HPI Here for evaluation of multiple issues. He did have a seizure in May of this year and apparently this was due to low blood sugars. He is being followed for his diabetes by Dr. Romero Belling. Since his seizure he has noted midthoracic pain especially with motion and breathing. He's had no fever, chills, cough or congestion. He also has a two-week history of left lateral rib pain that again is made worse with motion and again no symptoms as mentioned above. Also no nausea or vomiting. He also is had lesions in the palm of the left hand. At this time they're not giving him any trouble.   Review of Systems     Objective:   Physical Exam alert and in no distress. Lungs are clear to auscultation. Left lateral rib area does show tenderness to palpation over the lower lateral ribs. Exam of his upper back shows no palpable tenderness but he does state that it is in the upper thoracic area where he is feeling the discomfort. Exam of the hands does show 2 round smooth movable lesions in the palm of the hand that are not associated with the tendons.      Assessment & Plan:  Upper back pain - Plan: DG Thoracic Spine W/Swimmers  Cyst in hand  Rib pain on left side - Plan: CANCELED: DG Ribs Unilateral Left  plane that the cysts are not a big issue unless a mechanical he started to give him trouble than it can be removed. He was comfortable with no further intervention.

## 2015-05-08 ENCOUNTER — Ambulatory Visit (INDEPENDENT_AMBULATORY_CARE_PROVIDER_SITE_OTHER): Payer: Managed Care, Other (non HMO) | Admitting: Neurology

## 2015-05-08 ENCOUNTER — Encounter: Payer: Self-pay | Admitting: Neurology

## 2015-05-08 ENCOUNTER — Ambulatory Visit
Admission: RE | Admit: 2015-05-08 | Discharge: 2015-05-08 | Disposition: A | Discharge status: Home / Self Care | Payer: Managed Care, Other (non HMO) | Source: Ambulatory Visit | Attending: Family Medicine | Admitting: Family Medicine

## 2015-05-08 VITALS — BP 116/72 | HR 79 | Ht 72.0 in | Wt 201.0 lb

## 2015-05-08 DIAGNOSIS — E109 Type 1 diabetes mellitus without complications: Secondary | ICD-10-CM

## 2015-05-08 DIAGNOSIS — E162 Hypoglycemia, unspecified: Secondary | ICD-10-CM | POA: Diagnosis not present

## 2015-05-08 DIAGNOSIS — R569 Unspecified convulsions: Secondary | ICD-10-CM

## 2015-05-08 DIAGNOSIS — M549 Dorsalgia, unspecified: Secondary | ICD-10-CM

## 2015-05-08 DIAGNOSIS — R0781 Pleurodynia: Secondary | ICD-10-CM

## 2015-05-08 NOTE — Progress Notes (Signed)
PATIENT: Colin Roth DOB: 1965-09-29  Chief Complaint  Patient presents with  . Seizures    He had an isolated seizure in May 2016. He did not undergo any testing and he has not had another episode.  His wife woke up to him having tremors and being unresponsive in the middle of the night.  He felt is was related to his blood sugar being 40 when EMS arrived.     HISTORICAL  Colin Roth 49 yo RH male, seen in refer by his endocrinologist Dr.  Dorisann Frames in May 08 2015 for seizure in May 2016, his primary care physician is Dr. Sharlot Gowda  He had insulin-dependent diabetes, using levemir 60 units daily, Humalog KWIKPEN 10-16 dependent on calorie count before lunch, and dinner.  In May 2016, he was [place], had his regular Humalog KWIKPEN  injection around 6 PM before dinner, had desert at 7 PM, he gave himself extra insulin shots, he went to bed around 10 PM, around 2 AM in the morning, his wife noticed him shaking the bed, generalized tonic-clonic seizure activity, paramedic was called, glucose was 40, he was not taken to the hospital, his symptoms quickly improved after his glucose level recovered.  He denies history of seizure, works as a Hydrologist, he drinks 1-2 beers daily  REVIEW OF SYSTEMS: Full 14 system review of systems performed and notable only for weight gain, fatigue, urination problem, flushing, joints pain, headaches, seizure, decreased energy  ALLERGIES: No Known Allergies  HOME MEDICATIONS: Current Outpatient Prescriptions  Medication Sig Dispense Refill  . aspirin-acetaminophen-caffeine (EXCEDRIN MIGRAINE) 250-250-65 MG per tablet Take 1 tablet by mouth every 6 (six) hours as needed for headache.    . Insulin Detemir (LEVEMIR) 100 UNIT/ML Pen Inject 60 Units into the skin daily.    . insulin lispro (HUMALOG KWIKPEN) 100 UNIT/ML KiwkPen Inject 10-16 Units into the skin 2 (two) times daily.         PAST MEDICAL HISTORY: Past Medical History  Diagnosis Date  . Migraine   . Diabetes mellitus     type I  . Former smoker   . Asthma   . Renal disorder     kidney stones  . Seizure (HCC)     PAST SURGICAL HISTORY: Past Surgical History  Procedure Laterality Date  . Joint replacement      LEFT SHOULDER    FAMILY HISTORY: Family History  Problem Relation Age of Onset  . Heart disease Maternal Grandfather   . Hypertension Paternal Grandmother   . Thyroid disease Mother     SOCIAL HISTORY:  Social History   Social History  . Marital Status: Married    Spouse Name: Olegario Messier  . Number of Children: 1  . Years of Education: 13   Occupational History  . Engineer, maintenance   Social History Main Topics  . Smoking status: Former Smoker -- 0.00 packs/day    Types: Cigarettes    Quit date: 11/01/2010  . Smokeless tobacco: Never Used  . Alcohol Use: 0.0 oz/week    0 Standard drinks or equivalent per week     Comment: 1-2 beers per day  . Drug Use: No  . 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 Not on file   Social History Narrative   Describes wife's  health as "poor."   Right-handed.   Lives at home with wife.   2 cups caffeine daily.     PHYSICAL EXAM   Filed Vitals:   05/08/15 0730  BP: 116/72  Pulse: 79  Height: 6' (1.829 m)  Weight: 201 lb (91.173 kg)    Not recorded      Body mass index is 27.25 kg/(m^2).  PHYSICAL EXAMNIATION:  Gen: NAD, conversant, well nourised, obese, well groomed                     Cardiovascular: Regular rate rhythm, no peripheral edema, warm, nontender. Eyes: Conjunctivae clear without exudates or hemorrhage Neck: Supple, no carotid bruise. Pulmonary: Clear to auscultation bilaterally   NEUROLOGICAL EXAM:  MENTAL STATUS: Speech:    Speech is normal; fluent and spontaneous with normal comprehension.  Cognition:      Orientation to time, place and person     Normal recent and remote memory     Normal Attention span and concentration     Normal Language, naming, repeating,spontaneous speech     Fund of knowledge   CRANIAL NERVES: CN II: Visual fields are full to confrontation. Fundoscopic exam is normal with sharp discs and no vascular changes. Pupils are round equal and briskly reactive to light. CN III, IV, VI: extraocular movement are normal. No ptosis. CN V: Facial sensation is intact to pinprick in all 3 divisions bilaterally. Corneal responses are intact.  CN VII: Face is symmetric with normal eye closure and smile. CN VIII: Hearing is normal to rubbing fingers CN IX, X: Palate elevates symmetrically. Phonation is normal. CN XI: Head turning and shoulder shrug are intact CN XII: Tongue is midline with normal movements and no atrophy.  MOTOR: There is no pronator drift of out-stretched arms. Muscle bulk and tone are normal. Muscle strength is normal.  REFLEXES: Reflexes are 2+ and symmetric at the biceps, triceps, knees, and ankles. Plantar responses are flexor.  SENSORY: Intact to light touch, pinprick, position sense, and vibration sense are intact in fingers and toes.  COORDINATION: Rapid alternating movements and fine finger movements are intact. There is no dysmetria on finger-to-nose and heel-knee-shin.    GAIT/STANCE: Posture is normal. Gait is steady with normal steps, base, arm swing, and turning. Heel and toe walking are normal. Tandem gait is normal.  Romberg is absent.   DIAGNOSTIC DATA (LABS, IMAGING, TESTING) - I reviewed patient records, labs, notes, testing and imaging myself where available.   ASSESSMENT AND PLAN  Colin Roth is a 49 y.o. male   Insulin-dependent diabetes Nocturnal seizure following hypoglycemia episodes, documented glucose was 40  Complete evaluation with MRI of the brain with and without contrast  EEG  Return to clinic in 6 months   Levert Feinstein, M.D. Ph.D.  [facility] [address], [address] [place], [place] [address] Ph: [phone] Fax: [phone]  [id number] Talmage Nap, MD, Dr. Sharlot Gowda

## 2015-05-19 ENCOUNTER — Telehealth: Payer: Self-pay | Admitting: Family Medicine

## 2015-05-19 NOTE — Telephone Encounter (Signed)
Wife called wanting pt xray results.  Advised Dr. Susann GivensLalonde had been out of town.  Will request status and call her back at Kathy [phone]

## 2015-05-19 NOTE — Telephone Encounter (Signed)
The x-rays were all negative

## 2015-05-20 NOTE — Telephone Encounter (Signed)
Colin MessierKathy pt wife was informed x-ray normal she verbalized understanding

## 2015-05-28 ENCOUNTER — Ambulatory Visit (INDEPENDENT_AMBULATORY_CARE_PROVIDER_SITE_OTHER): Payer: Managed Care, Other (non HMO) | Admitting: Neurology

## 2015-05-28 ENCOUNTER — Ambulatory Visit (INDEPENDENT_AMBULATORY_CARE_PROVIDER_SITE_OTHER): Payer: Managed Care, Other (non HMO)

## 2015-05-28 DIAGNOSIS — E109 Type 1 diabetes mellitus without complications: Secondary | ICD-10-CM

## 2015-05-28 DIAGNOSIS — E162 Hypoglycemia, unspecified: Secondary | ICD-10-CM

## 2015-05-28 DIAGNOSIS — R569 Unspecified convulsions: Secondary | ICD-10-CM | POA: Diagnosis not present

## 2015-05-28 MED ORDER — GADOPENTETATE DIMEGLUMINE 469.01 MG/ML IV SOLN: 20.0000 mL | Freq: Once | INTRAVENOUS | Status: AC | PRN

## 2015-05-29 ENCOUNTER — Other Ambulatory Visit: Payer: Managed Care, Other (non HMO)

## 2015-05-29 NOTE — Procedures (Signed)
   HISTORY:  49 year old male, with history of  Insulin-dependent diabetes, had one episode of  nocturnal seizure with associated hypoglycemia  TECHNIQUE:  16 channel EEG was performed based on standard 10-16 international system. One channel was dedicated to EKG, which has demonstrates normal sinus rhythm of 72 beats per minutes.  Upon awakening, the posterior background activity was well-developed, in alpha range, 9 hz, reactive to eye opening and closure.  There was no evidence of epileptiform discharge.  Photic stimulation was performed, which induced a symmetric photic driving.  Hyperventilation was performed, there was no abnormality elicit.  No sleep was achieved.  CONCLUSION: This is a  Normal awake EEG.  There is no electrodiagnostic evidence of epileptiform discharge

## 2015-05-30 ENCOUNTER — Telehealth: Payer: Self-pay | Admitting: Neurology

## 2015-05-30 NOTE — Telephone Encounter (Signed)
MRI results reviewed - follow up appt already scheduled.

## 2015-05-30 NOTE — Telephone Encounter (Signed)
Please call patient, MRI of the brain showed single right frontal deep ischemic lesions, no acute findings, will review detail at his next follow-up visit  Abnormal MRI brain (with and without) demonstrating: 1. There is a right frontal periventricular T2 hyperintensity, round, measuring 9mm. No abnormal enhancement. This is non-specific and considerations include autoimmune, inflammatory, post-infectious or chronic ischemic etiologies.  2. No acute findings.

## 2015-06-02 NOTE — Telephone Encounter (Signed)
Wife called to request that a copy of MRI results be mailed to them.

## 2015-11-06 ENCOUNTER — Ambulatory Visit (INDEPENDENT_AMBULATORY_CARE_PROVIDER_SITE_OTHER): Payer: Managed Care, Other (non HMO) | Admitting: Neurology

## 2015-11-06 ENCOUNTER — Encounter: Payer: Self-pay | Admitting: Neurology

## 2015-11-06 ENCOUNTER — Telehealth: Payer: Self-pay | Admitting: Neurology

## 2015-11-06 VITALS — BP 131/79 | HR 88 | Ht 72.0 in | Wt 208.0 lb

## 2015-11-06 DIAGNOSIS — R569 Unspecified convulsions: Secondary | ICD-10-CM

## 2015-11-06 NOTE — Telephone Encounter (Signed)
Called patient to inform him his CD was ready to pick up. Left him a VM asking him to return my call if needed.

## 2015-11-06 NOTE — Progress Notes (Signed)
PATIENT: Colin Roth DOB: 06/17/1966  Chief Complaint  Patient presents with  . Seizures    Reports no further seizure activity.     HISTORICAL  Colin Roth 50 yo RH male, seen in refer by his endocrinologist Dr.  Dorisann FramesBindubal Balan in May 08 2015 for seizure in May 2016, his primary care physician is Dr. Sharlot GowdaJohn Lalonde  He had insulin-dependent diabetes, using levemir 60 units daily, Humalog KWIKPEN 10-16 dependent on calorie count before lunch, and dinner.  In May 2016, he was at a [place] reunion at beach, had his regular Humalog KWIKPEN  injection around 6 PM before dinner, had desert at 7 PM, he gave himself extra insulin shots, he went to bed around 10 PM, around 2 AM in the morning, his wife noticed him shaking the bed, generalized tonic-clonic seizure activity, paramedic was called, glucose was 40, he was not taken to the hospital, his symptoms quickly improved after his glucose level recovered.  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, he drinks 1-2 beers daily  Update November 06 2015: He has been doing very well, no recurrent seizure, his diabetes under excellent control,  We have personally reviewed MRI of the brain in October 2016, single isolated deep right frontal white matter lesion, no contrast enhancement, most consistent with chronic small vessel disease,  EEG was normal  REVIEW OF SYSTEMS: Full 14 system review of systems performed and notable only for weight gain, fatigue, urination problem, flushing, joints pain, headaches, seizure, decreased energy  ALLERGIES: No Known Allergies  HOME MEDICATIONS: Current Outpatient Prescriptions  Medication Sig Dispense Refill  . aspirin-acetaminophen-caffeine (EXCEDRIN MIGRAINE) 250-250-65 MG per tablet Take 1 tablet by mouth every 6 (six) hours as needed for headache.    . Insulin Detemir (LEVEMIR) 100 UNIT/ML Pen Inject 60 Units into the skin daily.    . insulin  lispro (HUMALOG KWIKPEN) 100 UNIT/ML KiwkPen Inject 10-16 Units into the skin 2 (two) times daily.        PAST MEDICAL HISTORY: Past Medical History  Diagnosis Date  . Migraine   . Diabetes mellitus     type I  . Former smoker   . Asthma   . Renal disorder     kidney stones  . Seizure (HCC)     PAST SURGICAL HISTORY: Past Surgical History  Procedure Laterality Date  . Joint replacement      LEFT SHOULDER    FAMILY HISTORY: Family History  Problem Relation Age of Onset  . Heart disease Maternal Grandfather   . Hypertension Paternal Grandmother   . Thyroid disease Mother     SOCIAL HISTORY:  Social History   Social History  . Marital Status: Married    Spouse Name: Colin Roth  . Number of Children: 1  . Years of Education: 13   Occupational History  . Engineer, maintenanceroject Manager     Audio/Visual   Social History Main Topics  . Smoking status: Former Smoker -- 0.00 packs/day    Types: Cigarettes    Quit date: 11/01/2010  . Smokeless tobacco: Never Used  . Alcohol Use: 0.0 oz/week    0 Standard drinks or equivalent per week     Comment: 1-2 beers per day  . Drug Use: No  . 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 Not on file   Social History Narrative  Describes wife's health as "poor."   Right-handed.   Lives at home with wife.   2 cups caffeine daily.     PHYSICAL EXAM   Filed Vitals:   11/06/15 0844  BP: 131/79  Pulse: 88  Height: 6' (1.829 m)  Weight: 208 lb (94.348 kg)    Not recorded      Body mass index is 28.2 kg/(m^2).  PHYSICAL EXAMNIATION:  Gen: NAD, conversant, well nourised, obese, well groomed                     Cardiovascular: Regular rate rhythm, no peripheral edema, warm, nontender. Eyes: Conjunctivae clear without exudates or hemorrhage Neck: Supple, no carotid bruise. Pulmonary: Clear to auscultation bilaterally   NEUROLOGICAL EXAM:  MENTAL  STATUS: Speech:    Speech is normal; fluent and spontaneous with normal comprehension.  Cognition:     Orientation to time, place and person     Normal recent and remote memory     Normal Attention span and concentration     Normal Language, naming, repeating,spontaneous speech     Fund of knowledge   CRANIAL NERVES: CN II: Visual fields are full to confrontation. Fundoscopic exam is normal with sharp discs and no vascular changes. Pupils are round equal and briskly reactive to light. CN III, IV, VI: extraocular movement are normal. No ptosis. CN V: Facial sensation is intact to pinprick in all 3 divisions bilaterally. Corneal responses are intact.  CN VII: Face is symmetric with normal eye closure and smile. CN VIII: Hearing is normal to rubbing fingers CN IX, X: Palate elevates symmetrically. Phonation is normal. CN XI: Head turning and shoulder shrug are intact CN XII: Tongue is midline with normal movements and no atrophy.  MOTOR: There is no pronator drift of out-stretched arms. Muscle bulk and tone are normal. Muscle strength is normal.  REFLEXES: Reflexes are 2+ and symmetric at the biceps, triceps, knees, and ankles. Plantar responses are flexor.  SENSORY: Intact to light touch, pinprick, position sense, and vibration sense are intact in fingers and toes.  COORDINATION: Rapid alternating movements and fine finger movements are intact. There is no dysmetria on finger-to-nose and heel-knee-shin.    GAIT/STANCE: Posture is normal. Gait is steady with normal steps, base, arm swing, and turning. Heel and toe walking are normal. Tandem gait is normal.  Romberg is absent.   DIAGNOSTIC DATA (LABS, IMAGING, TESTING) - I reviewed patient records, labs, notes, testing and imaging myself where available.   ASSESSMENT AND PLAN  [name] is a 50 y.o. male   Insulin-dependent diabetes Nocturnal seizure following hypoglycemia episodes, documented glucose was 40 in May  2016 Abnormal MRI of brain  Right frontal single isolated white matter lesion most consistent with small vessel disease  I have advised him increase water intake, aspirin 81 mg daily   Levert Feinstein, M.D. Ph.D.  [facility] [address] [place], [place] [address] Ph: [phone] Fax: [phone]  [id number] Talmage Nap, MD, Dr. Sharlot Roth

## 2015-11-10 NOTE — Telephone Encounter (Signed)
Patient's wife is calling back and requesting that the CD be mailed to them @[address], [place], [place] [id number].

## 2015-11-14 NOTE — Telephone Encounter (Signed)
Wife called back to check status of CD. Is it up front or was it mailed? Please call to advise.

## 2015-11-18 NOTE — Telephone Encounter (Signed)
Colin Roth mailed CD.

## 2016-01-12 NOTE — Telephone Encounter (Signed)
done

## 2016-05-28 ENCOUNTER — Ambulatory Visit (INDEPENDENT_AMBULATORY_CARE_PROVIDER_SITE_OTHER): Payer: Worker's Compensation | Admitting: Family Medicine

## 2016-05-28 ENCOUNTER — Ambulatory Visit (INDEPENDENT_AMBULATORY_CARE_PROVIDER_SITE_OTHER): Payer: Worker's Compensation

## 2016-05-28 VITALS — BP 124/74 | HR 83 | Temp 97.7°F | Resp 18 | Ht 72.0 in | Wt 209.0 lb

## 2016-05-28 DIAGNOSIS — M25562 Pain in left knee: Secondary | ICD-10-CM

## 2016-05-28 NOTE — Patient Instructions (Addendum)
Thank you for coming in today. Use Tylenol or ice as needed for pain control. Return to work on Monday. Follow-up with previous orthopedic provider or primary care provider if not improving.      IF you received an x-ray today, you will receive an invoice from [facility]. Please contact [facility] at [phone] with questions or concerns regarding your invoice.   IF you received labwork today, you will receive an invoice from [facility]. Please contact [facility] at [phone] with questions or concerns regarding your invoice.   Our billing staff will not be able to assist you with questions regarding bills from these companies.  You will be contacted with the lab results as soon as they are available. The fastest way to get your results is to activate your My Chart account. Instructions are located on the last page of this paperwork. If you have not heard from us regarding the results in 2 weeks, please contact this office.    '

## 2016-05-28 NOTE — Progress Notes (Addendum)
    Colin Roth is a 50 y.o. male who [facility] pain. Patient was in his normal state of health when he suffered an injury yesterday. He was receiving a letter from work and twisted his knee. He felt a pop on the posterior aspect of the knee. He had immediate pain worse with ambulation. Since then the pain has improved a bit. He denies any swelling locking or catching. No fevers chills nausea vomiting diarrhea or treatment tried yet. He denies a significant history of pain in the left knee prior to this injury.  Past medical surgical social history reviewed. Medications and allergies reviewed.   Exam:  BP 124/74   Pulse 83   Temp 97.7 F (36.5 C) (Oral)   Resp 18   Ht 6' (1.829 m)   Wt 209 lb (94.8 kg)   SpO2 98%   BMI 28.35 kg/m  Gen: Well NAD Left knee unremarkable. Without effusion. Nontender along the posterior aspect of the knee. Mildly tender palpation along the medial joint line. Mildly positive medial McMurray's test negative lateral. Lax anterior drawer test without significant firm endpoint noted. Intact posterior drawer test. Intact extension and flexion strength.  No results found for this or any previous visit (from the past 24 hour(s)). Dg Knee Complete 4 Views Left  Result Date: 05/28/2016 CLINICAL DATA:  Acute left knee pain. EXAM: LEFT KNEE - COMPLETE 4+ VIEW COMPARISON:  None. FINDINGS: No evidence of fracture, dislocation, or joint effusion. No evidence of arthropathy or other focal bone abnormality. Soft tissues are unremarkable. IMPRESSION: Negative. Electronically Signed   By: Amie Portlandavid  Ormond M.D.   On: 05/28/2016 15:46    Assessment and Plan: 50 y.o. male with knee injury at work. Improving. Treat with over-the-counter pain medications ice rest. Follow-up with PCP orthopedics if not improved.  Discussed warning signs or symptoms. Please see discharge instructions. Patient expresses understanding.

## 2016-12-12 IMAGING — CR DG RIBS W/ CHEST 3+V*L*
3 series · 3 of 3 positions shown · non-contrast
Comparison: Chest radiograph May 17, 2011

CLINICAL DATA: Two week history of left lower rib pain. No history
of trauma

EXAM:
LEFT RIBS AND CHEST - 3+ VIEW

[w chest pa *]
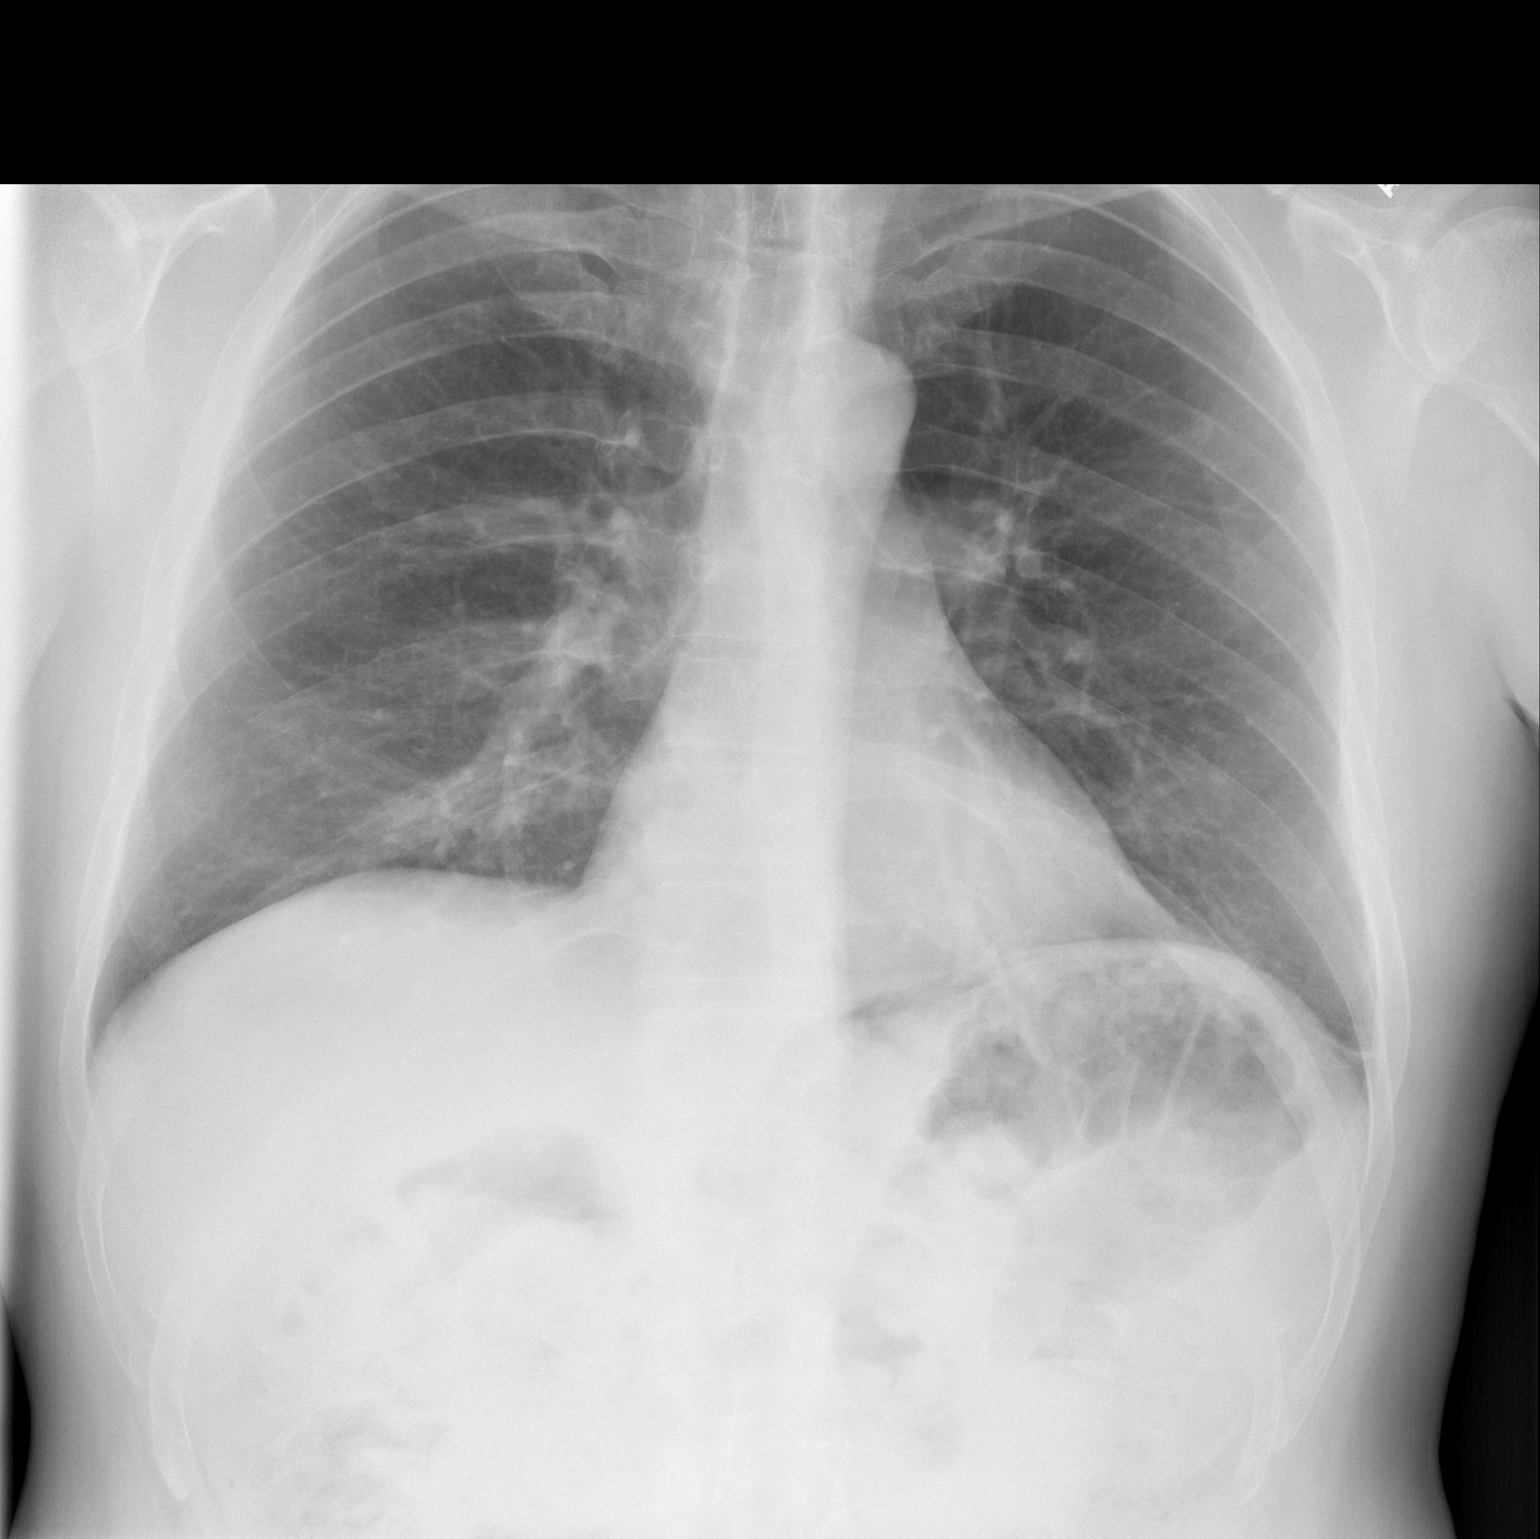

[w ribs ap/pa lower left *]
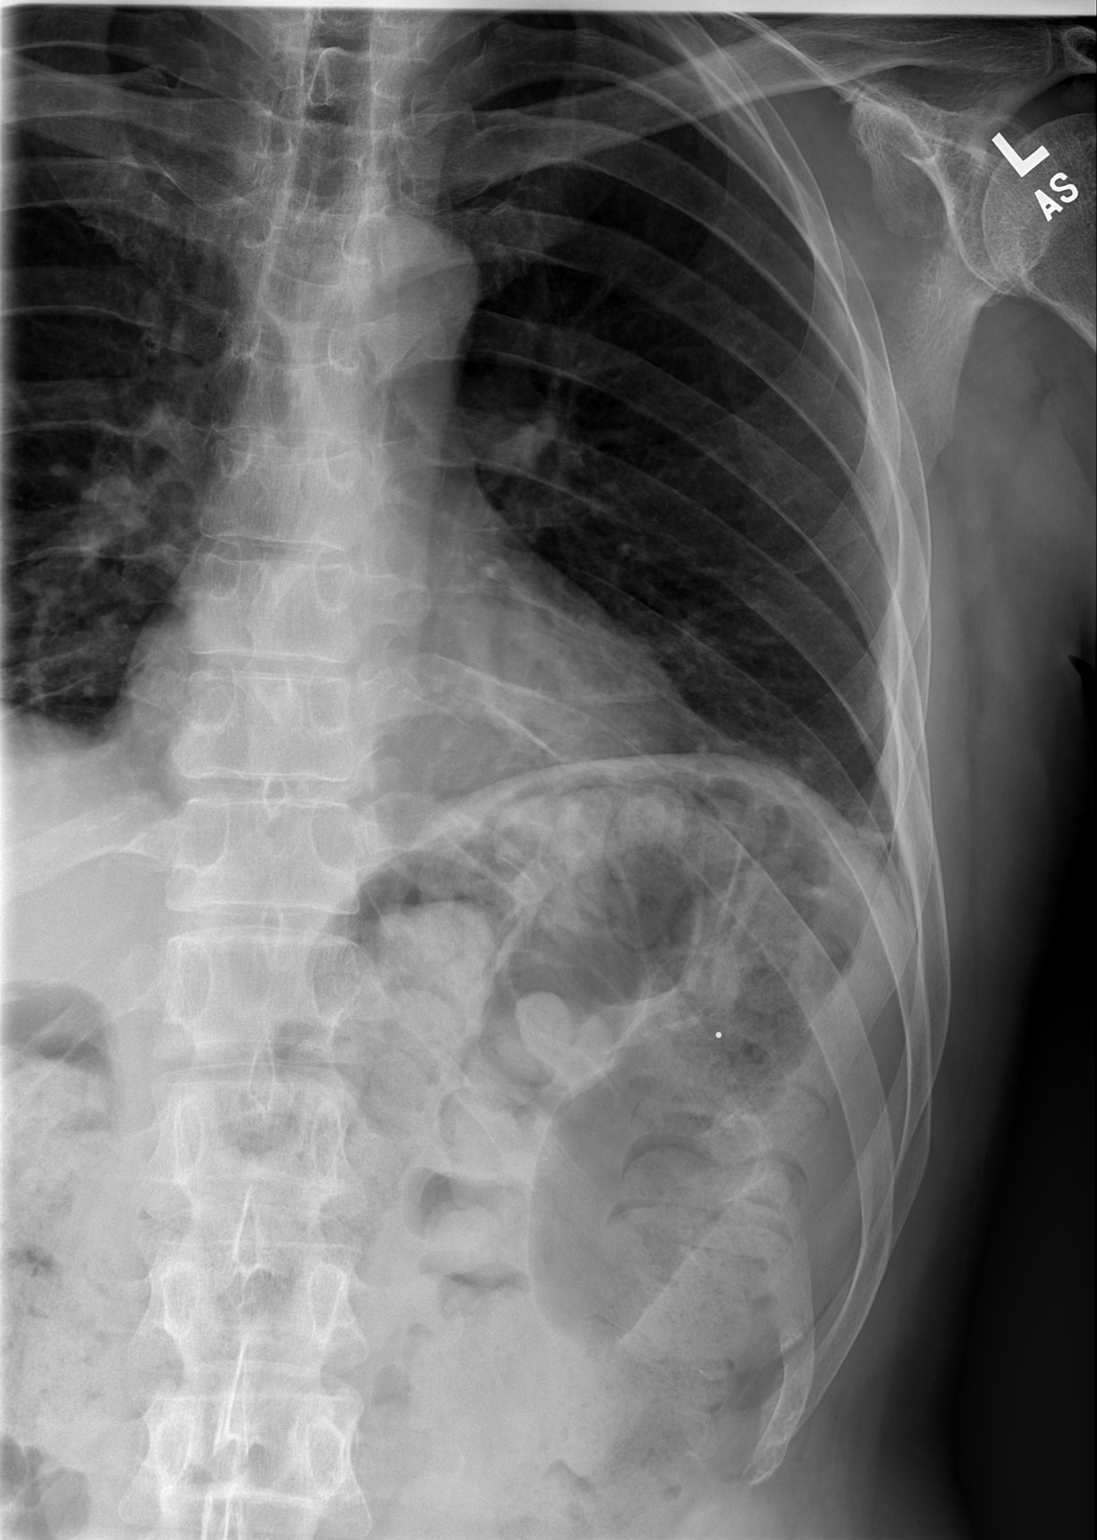

[w ribs oblique left *]
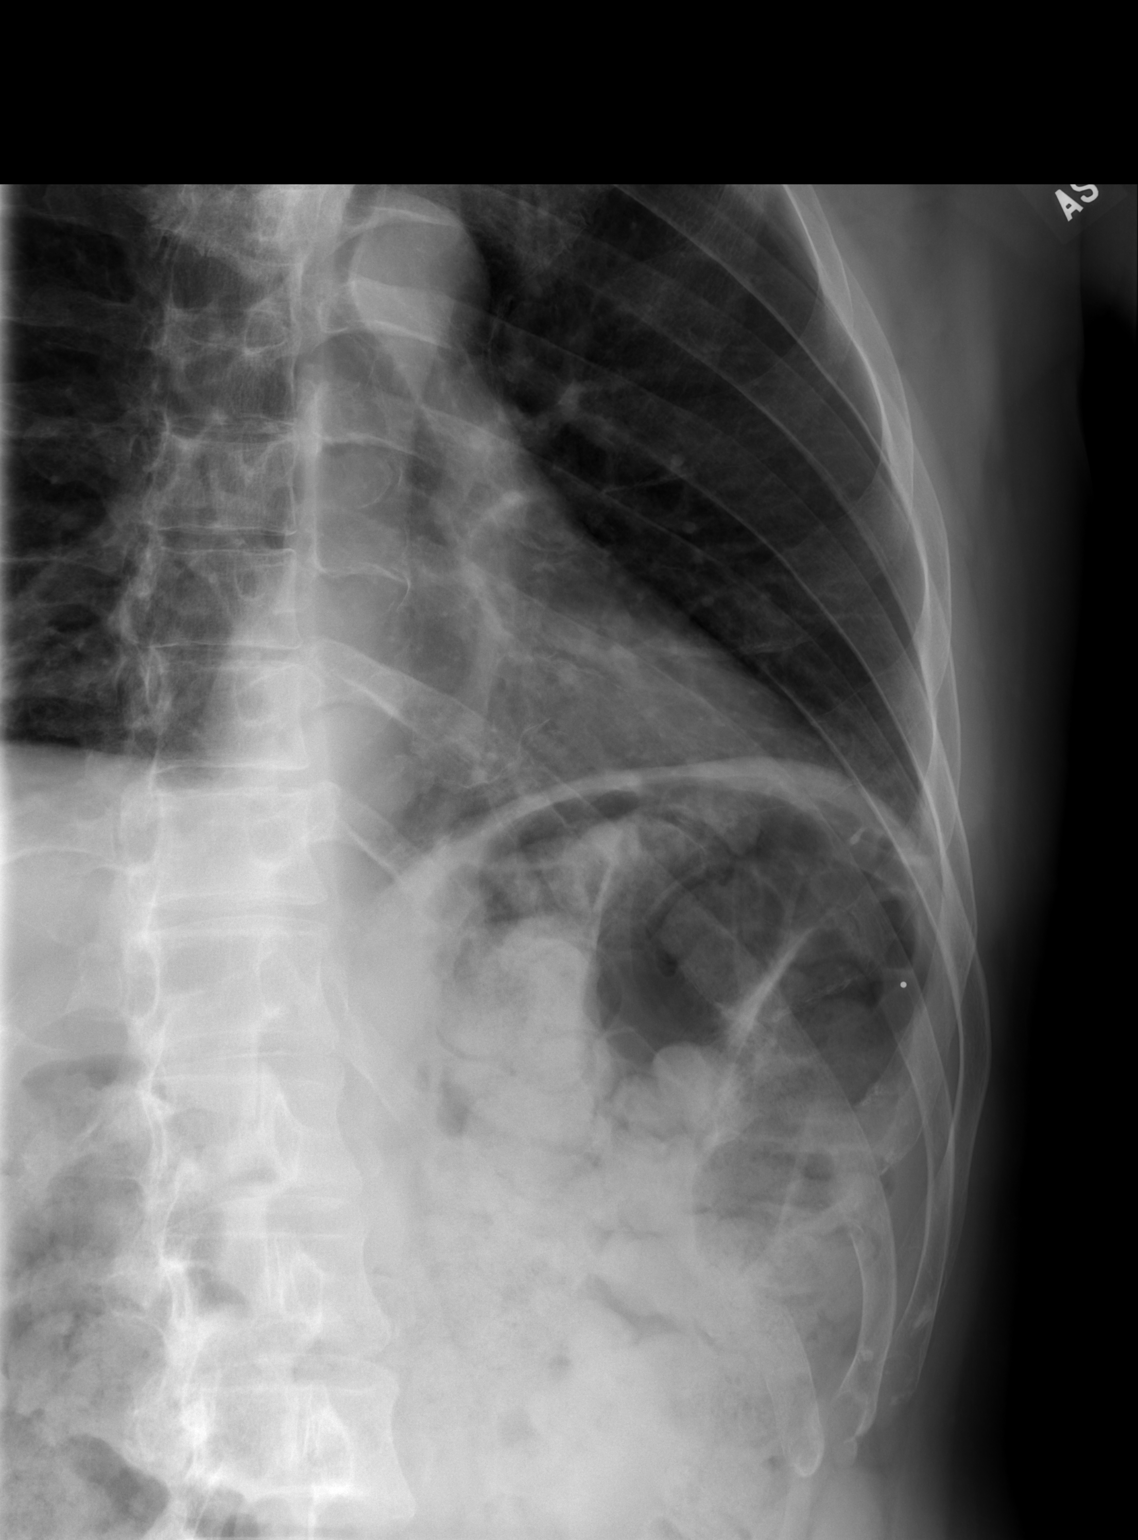

[3 of 3 positions shown; findings below may reference images not displayed]

FINDINGS: Frontal chest as well as oblique and cone-down lower rib images were
obtained. There is slight atelectasis in the left base. Lungs
elsewhere are clear. Heart size and pulmonary vascularity are
normal. No adenopathy. There is no demonstrable pneumothorax or
effusion. There is no apparent rib fracture.
IMPRESSION: No demonstrable rib fracture. Slight atelectasis left base. Lungs
otherwise clear.

## 2017-03-12 ENCOUNTER — Emergency Department (HOSPITAL_COMMUNITY): Payer: 59

## 2017-03-12 ENCOUNTER — Encounter (HOSPITAL_COMMUNITY): Payer: Self-pay | Admitting: *Deleted

## 2017-03-12 ENCOUNTER — Emergency Department (HOSPITAL_COMMUNITY)
Admission: EM | Admit: 2017-03-12 | Discharge: 2017-03-12 | Disposition: A | Discharge status: Home / Self Care | Payer: 59 | Attending: Emergency Medicine | Admitting: Emergency Medicine

## 2017-03-12 DIAGNOSIS — Y9389 Activity, other specified: Secondary | ICD-10-CM | POA: Diagnosis not present

## 2017-03-12 DIAGNOSIS — E119 Type 2 diabetes mellitus without complications: Secondary | ICD-10-CM | POA: Insufficient documentation

## 2017-03-12 DIAGNOSIS — M25561 Pain in right knee: Secondary | ICD-10-CM | POA: Diagnosis present

## 2017-03-12 DIAGNOSIS — M7041 Prepatellar bursitis, right knee: Secondary | ICD-10-CM | POA: Diagnosis not present

## 2017-03-12 DIAGNOSIS — Z794 Long term (current) use of insulin: Secondary | ICD-10-CM | POA: Insufficient documentation

## 2017-03-12 DIAGNOSIS — Z87891 Personal history of nicotine dependence: Secondary | ICD-10-CM | POA: Insufficient documentation

## 2017-03-12 LAB — CBC WITH DIFFERENTIAL/PLATELET
BASOS ABS: 0 10*3/uL (ref 0.0–0.1)
Basophils Relative: 0 %
EOS ABS: 0 10*3/uL (ref 0.0–0.7)
EOS PCT: 0 %
HCT: 40.7 % (ref 39.0–52.0)
Hemoglobin: 13.9 g/dL (ref 13.0–17.0)
Lymphocytes Relative: 14 %
Lymphs Abs: 1.3 10*3/uL (ref 0.7–4.0)
MCH: 30.7 pg (ref 26.0–34.0)
MCHC: 34.2 g/dL (ref 30.0–36.0)
MCV: 89.8 fL (ref 78.0–100.0)
MONO ABS: 0.7 10*3/uL (ref 0.1–1.0)
Monocytes Relative: 7 %
Neutro Abs: 7.4 10*3/uL (ref 1.7–7.7)
Neutrophils Relative %: 79 %
PLATELETS: 206 10*3/uL (ref 150–400)
RBC: 4.53 MIL/uL (ref 4.22–5.81)
RDW: 12.9 % (ref 11.5–15.5)
WBC: 9.4 10*3/uL (ref 4.0–10.5)

## 2017-03-12 LAB — BASIC METABOLIC PANEL
ANION GAP: 7 (ref 5–15)
BUN: 12 mg/dL (ref 6–20)
CALCIUM: 8.9 mg/dL (ref 8.9–10.3)
CO2: 28 mmol/L (ref 22–32)
Chloride: 101 mmol/L (ref 101–111)
Creatinine, Ser: 0.84 mg/dL (ref 0.61–1.24)
GFR calc Af Amer: >60 mL/min (ref >60)
Glucose, Bld: 255 mg/dL — ABNORMAL HIGH (ref 65–99)
POTASSIUM: 4.5 mmol/L (ref 3.5–5.1)
SODIUM: 136 mmol/L (ref 135–145)

## 2017-03-12 LAB — URIC ACID: Uric Acid, Serum: 4.2 mg/dL — ABNORMAL LOW (ref 4.4–7.6)

## 2017-03-12 LAB — SEDIMENTATION RATE: SED RATE: 1 mm/h (ref 0–16)

## 2017-03-12 LAB — C-REACTIVE PROTEIN: CRP: <0.8 mg/dL (ref <1.0)

## 2017-03-12 MED ORDER — LIDOCAINE HCL (PF) 1 % IJ SOLN
INTRAMUSCULAR | Status: AC
  Filled 2017-03-12: qty 30

## 2017-03-12 MED ORDER — SULFAMETHOXAZOLE-TRIMETHOPRIM 800-160 MG PO TABS: 1.0000 | ORAL_TABLET | Freq: Two times a day (BID) | ORAL | 0 refills | Status: DC

## 2017-03-12 MED ORDER — IOPAMIDOL (ISOVUE-300) INJECTION 61%
INTRAVENOUS | Status: AC
  Administered 2017-03-12: 100 mL via INTRAVENOUS
  Filled 2017-03-12: qty 100

## 2017-03-12 MED ORDER — MORPHINE SULFATE (PF) 4 MG/ML IV SOLN
4.0000 mg | Freq: Once | INTRAVENOUS | Status: AC
Start: 2017-03-12 — End: 2017-03-12
  Administered 2017-03-12: 4 mg via INTRAVENOUS
  Filled 2017-03-12: qty 1

## 2017-03-12 MED ORDER — OXYCODONE-ACETAMINOPHEN 5-325 MG PO TABS: 1.0000 | ORAL_TABLET | Freq: Four times a day (QID) | ORAL | 0 refills | Status: DC | PRN

## 2017-03-12 NOTE — ED Notes (Signed)
RN STARTING IV 

## 2017-03-12 NOTE — Consult Note (Signed)
Patient ID: Colin Roth MRN: [id number] DOB/AGE: 1966-05-08 51 y.o.  Admit date: 03/12/2017  Admission Diagnoses:  Prepatellar Bursitis Right Knee  HPI: Very pleasant 51 year old male pt with a past med hx of DM type 2.  The pt reports having this diagnosis for 4 years.  He said he had difficulty controlling his blood sugars at first.  He says since he has been on an insulin regimen he has been more controlled.  He reports his last A1c was under 8.  The pt reports being brought in with DKA back and 2012 and was diagnosed with MRSA.  He denies having any issues with it since.  He presents to the ED today with a swollen right knee.  He reports waking up in the night with pain and realizing his knee was swollen and very painful.   Past Medical History: Past Medical History:  Diagnosis Date  . Diabetes mellitus    type I  . Former smoker   . Migraine   . Renal disorder    kidney stones  . Seizure [facility])     Surgical History: Past Surgical History:  Procedure Laterality Date  . JOINT REPLACEMENT     LEFT SHOULDER    Family History: Family History  Problem Relation Age of Onset  . Thyroid disease Mother   . Heart disease Maternal Grandfather   . Hypertension Paternal Grandmother     Social History: Social History   Social History  . Marital status: Married    Spouse name: Olegario Messier  . Number of children: 1  . Years of education: 54   Occupational History  . Project Radio broadcast assistant   Social History Main Topics  . Smoking status: Former Smoker    Packs/day: 0.00    Types: Cigarettes    Quit date: 11/01/2010  . Smokeless tobacco: Never Used  . Alcohol use 0.0 oz/week     Comment: 1-2 beers per day  . Drug use: No  . 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 Not on file   Social History Narrative   Describes wife's health as "poor."   Right-handed.   Lives at home with  wife.   2 cups caffeine daily.    Allergies: Patient has no known allergies.  Medications: I have reviewed the patient's current medications.  Vital Signs: Patient Vitals for the past 24 hrs:  BP Temp Temp src Pulse Resp SpO2  03/12/17 1230 119/86 - - 85 - 97 %  03/12/17 1044 124/76 - - 86 18 95 %  03/12/17 0825 139/84 98.6 F (37 C) Oral 84 16 99 %    Radiology: Ct Knee Right W Contrast  Result Date: 03/12/2017 CLINICAL DATA:  Anterior knee pain. EXAM: CT OF THE right KNEE WITH CONTRAST TECHNIQUE: Multidetector CT imaging was performed following the standard protocol during bolus administration of intravenous contrast. COMPARISON:  Radiographs 03/12/2017 FINDINGS: The joint spaces are maintained. No acute bony findings or degenerative changes. No osteochondral lesion. No knee joint effusion. The cruciate and collateral ligaments appear grossly normal by CT. The quadriceps and patellar tendons are intact. There is moderate fluid overlying the patella and patellar tendon anteriorly. This is most consistent with prepatellar bursitis. There is also some surrounding inflammation. The knee musculature appears normal. The major vascular structures appear normal. IMPRESSION: CT findings most consistent with prepatellar bursitis.  No acute bony findings or significant degenerative changes. No joint effusion. Electronically Signed   By: Rudie MeyerP.  Gallerani M.D.   On: 03/12/2017 12:13   Dg Knee Complete 4 Views Right  Result Date: 03/12/2017 CLINICAL DATA:  Knee pain, no known injury, initial encounter EXAM: RIGHT KNEE - COMPLETE 4+ VIEW COMPARISON:  None. FINDINGS: No acute fracture or dislocation is noted. Prepatellar soft tissue swelling is seen. No joint effusion is noted. IMPRESSION: Mild soft tissue swelling is noted. No other focal abnormality is seen. Electronically Signed   By: Alcide CleverMark  Lukens M.D.   On: 03/12/2017 09:18    Labs:  Recent Labs  03/12/17 1035  WBC 9.4  RBC 4.53  HCT 40.7  PLT  206    Recent Labs  03/12/17 1035  NA 136  K 4.5  CL 101  CO2 28  BUN 12  CREATININE 0.84  GLUCOSE 255*  CALCIUM 8.9   No results for input(s): LABPT, INR in the last 72 hours.  Review of Systems: ROS  Physical Exam: Neurologically intact ABD soft Sensation intact distally Dorsiflexion/Plantar flexion intact Compartment soft  ROM in tact of the right knee Knee is edematous, mild erythema, warm  and tender  to touch  Assessment and Plan: Prepatellar Bursitis Aspirated the knee while pt was in ED Sending fluid for cultures and cell count Pt will f/u with Dr. Aundria Rudogers in clinic  Will DC pt with anx Advised use of NSAIDs and ice   Anette Riedelarmen Mayo, PAC for Venita Lickahari Brooks, MD [facility] [phone]

## 2017-03-12 NOTE — ED Notes (Signed)
Bed: WA05 Expected date:  Expected time:  Means of arrival:  Comments: 

## 2017-03-12 NOTE — ED Triage Notes (Signed)
Pt reports that last week when pt kneeled down, it felt like pt was kneeling down on a "screw" in his right knee.  Pt reports that he avoided kneeling down on the knee but this morning pt woke up with knee pain.  Pt denies any recent falls or traumas.  Pt's right knee looks more swollen than left knee.  Pt is a/o x 4 and ambulatory.

## 2017-03-12 NOTE — Discharge Instructions (Signed)
Return to the ED with any concerns including increased pain/swelling/discoloration, fever/chills, decreased level of alertness/lethargy, or any other alarming symptoms

## 2017-03-12 NOTE — ED Provider Notes (Signed)
[facility]ERGENCY DEPT Provider Note   CSN: [id number] Arrival date & time: 03/12/17  4098     History   Chief Complaint Chief Complaint  Patient presents with  . Knee Pain    right    HPI Colin Roth is a 51 y.o. male.  HPI  Pt with hx of diabetes presents with c/o right knee pain.  He felt some pain last week when kneeling down on his knee while working.  He stopped putting pressure on his knee for several days and felt better, but then last night right knee began to swell and become painful.  This morning he found his right knee very swollen and red and painful.  No other significant injury.  No fever/chills.  No nausea or other systemic symptoms.  There are no other associated systemic symptoms, there are no other alleviating or modifying factors.   Past Medical History:  Diagnosis Date  . Diabetes mellitus    type I  . Former smoker   . Migraine   . Renal disorder    kidney stones  . Seizure [facility])     Patient Active Problem List   Diagnosis Date Noted  . Seizures (HCC) 11/06/2015  . GERD (gastroesophageal reflux disease) 06/10/2011  . Leukocytosis 06/09/2011  . DM type 2, not at goal [facility]) 06/09/2011    Past Surgical History:  Procedure Laterality Date  . JOINT REPLACEMENT     LEFT SHOULDER       Home Medications    Prior to Admission medications   Medication Sig Start Date End Date Taking? Authorizing Provider  ibuprofen (ADVIL,MOTRIN) 200 MG tablet Take 600 mg by mouth every 6 (six) hours as needed.   Yes [provider]  Insulin Detemir (LEVEMIR) 100 UNIT/ML Pen Inject 50 Units into the skin daily.    Yes [provider]  insulin lispro (HUMALOG KWIKPEN) 100 UNIT/ML KiwkPen Inject 8-16 Units into the skin 2 (two) times daily.  10/08/14  Yes [provider]  oxyCODONE-acetaminophen (PERCOCET/ROXICET) 5-325 MG tablet Take 1-2 tablets by mouth every 6 (six) hours as needed for severe pain. 03/12/17   [employer], Latanya Maudlin, MD    sulfamethoxazole-trimethoprim (BACTRIM DS,SEPTRA DS) 800-160 MG tablet Take 1 tablet by mouth 2 (two) times daily. 03/12/17   Mayo, Baxter Kail, PA-C    Family History Family History  Problem Relation Age of Onset  . Thyroid disease Mother   . Heart disease Maternal Grandfather   . Hypertension Paternal Grandmother     Social History Social History  Substance Use Topics  . Smoking status: Former Smoker    Packs/day: 0.00    Types: Cigarettes    Quit date: 11/01/2010  . Smokeless tobacco: Never Used  . Alcohol use 0.0 oz/week     Comment: 1-2 beers per day     Allergies   Patient has no known allergies.   Review of Systems Review of Systems  ROS reviewed and all otherwise negative except for mentioned in HPI   Physical Exam Updated Vital Signs BP 121/75   Pulse 86   Temp 98.6 F (37 C) (Oral)   Resp 18   SpO2 95%  Vitals reviewed Physical Exam Physical Examination: General appearance - alert, well appearing, and in no distress Mental status - alert, oriented to person, place, and time Eyes - no conjunctival injecton, no scleral icterus Chest - clear to auscultation, no wheezes, rales or rhonchi, symmetric air entry Heart - normal rate, regular rhythm, normal S1, S2,  no murmurs, rubs, clicks or gallops Neurological - alert, oriented, normal speech, sensation and strength of RLE is intact Musculoskeletal - swelling/erythema and tenderness overlying anterior right knee- swelling over patellar region and some ttp over medial joint line- pt has FROM of right knee with increased pain over the patella but not within the joint, otherwise  no joint tenderness, deformity or swelling Extremities - peripheral pulses normal, no pedal edema, no clubbing or cyanosis Skin - normal coloration and turgor, no rashes- erythema overlying anterior right knee  ED Treatments / Results  Labs (all labs ordered are listed, but only abnormal results are displayed) Labs Reviewed  BASIC  METABOLIC PANEL - Abnormal; Notable for the following:       Result Value   Glucose, Bld 255 (*)    All other components within normal limits  URIC ACID - Abnormal; Notable for the following:    Uric Acid, Serum 4.2 (*)    All other components within normal limits  BODY FLUID CULTURE  CBC WITH DIFFERENTIAL/PLATELET  SEDIMENTATION RATE  C-REACTIVE PROTEIN    EKG  EKG Interpretation None       Radiology Ct Knee Right W Contrast  Result Date: 03/12/2017 CLINICAL DATA:  Anterior knee pain. EXAM: CT OF THE right KNEE WITH CONTRAST TECHNIQUE: Multidetector CT imaging was performed following the standard protocol during bolus administration of intravenous contrast. COMPARISON:  Radiographs 03/12/2017 FINDINGS: The joint spaces are maintained. No acute bony findings or degenerative changes. No osteochondral lesion. No knee joint effusion. The cruciate and collateral ligaments appear grossly normal by CT. The quadriceps and patellar tendons are intact. There is moderate fluid overlying the patella and patellar tendon anteriorly. This is most consistent with prepatellar bursitis. There is also some surrounding inflammation. The knee musculature appears normal. The major vascular structures appear normal. IMPRESSION: CT findings most consistent with prepatellar bursitis. No acute bony findings or significant degenerative changes. No joint effusion. Electronically Signed   By: Rudie Meyer M.D.   On: 03/12/2017 12:13   Dg Knee Complete 4 Views Right  Result Date: 03/12/2017 CLINICAL DATA:  Knee pain, no known injury, initial encounter EXAM: RIGHT KNEE - COMPLETE 4+ VIEW COMPARISON:  None. FINDINGS: No acute fracture or dislocation is noted. Prepatellar soft tissue swelling is seen. No joint effusion is noted. IMPRESSION: Mild soft tissue swelling is noted. No other focal abnormality is seen. Electronically Signed   By: Alcide Clever M.D.   On: 03/12/2017 09:18    Procedures Procedures (including  critical care time)  Medications Ordered in ED Medications  morphine 4 MG/ML injection 4 mg (4 mg Intravenous Given 03/12/17 1042)  iopamidol (ISOVUE-300) 61 % injection (100 mLs Intravenous Contrast Given 03/12/17 1144)     Initial Impression / Assessment and Plan / ED Course  I have reviewed the triage vital signs and the nursing notes.  Pertinent labs & imaging results that were available during my care of the patient were reviewed by me and considered in my medical decision making (see chart for details).    12:43 PM  D/w Dr. Shon Baton, he will see patient when he is out of the OR.    Clinically concern for prepatellar bursiits versus septic joint- however doubt septic joint due to good ROM and localized area of pain/swelling anterior to patella- CT knee show bursitis as well.  D/w ortho and PA came to bedside and incised bursa- fluid sent for studies- although ortho verbalizes there may not be enough fluid for all  tests.  Pt placed on bactrim.  Ace wrap applied and to f/u with Dr. Aundria Rudogers next week.  Discharged with strict return precautions.  Pt agreeable with plan.  Final Clinical Impressions(s) / ED Diagnoses   Final diagnoses:  Prepatellar bursitis of right knee    New Prescriptions Discharge Medication List as of 03/12/2017  3:32 PM    START taking these medications   Details  sulfamethoxazole-trimethoprim (BACTRIM DS,SEPTRA DS) 800-160 MG tablet Take 1 tablet by mouth 2 (two) times daily., Starting Sat 03/12/2017, Print         Mikaelyn Arthurs, Latanya MaudlinMartha L, MD 03/13/17 [phone]

## 2017-03-16 LAB — BODY FLUID CULTURE

## 2017-03-17 ENCOUNTER — Telehealth: Payer: Self-pay | Admitting: *Deleted

## 2017-03-17 NOTE — Telephone Encounter (Signed)
Post ED Visit - Positive Culture Follow-up  Culture report reviewed by antimicrobial stewardship pharmacist:  []  Enzo BiNathan Batchelder, Pharm.D. []  Celedonio MiyamotoJeremy Frens, Pharm.D., BCPS AQ-ID []  Garvin FilaMike Maccia, Pharm.D., BCPS []  Georgina PillionElizabeth Martin, Pharm.D., BCPS []  DunwoodyMinh Pham, [address] BoulevardPharm.D., BCPS, AAHIVP []  Estella HuskMichelle Turner, Pharm.D., BCPS, AAHIVP []  Lysle Pearlachel Rumbarger, PharmD, BCPS []  Casilda Carlsaylor Stone, PharmD, BCPS []  Pollyann SamplesAndy Johnston, PharmD, BCPS Verlan FriendsErin Deja, Pharm D  Positive body fluid culture Treated with Sulfamethoxazole-Trimethoprim, organism sensitive to the same and no further patient follow-up is required at this time.  Virl AxeRobertson, Olga Bourbeau [facility] 03/17/2017, 12:28 PM

## 2017-04-26 LAB — HM DIABETES EYE EXAM

## 2017-05-06 ENCOUNTER — Encounter: Payer: Self-pay | Admitting: Family Medicine

## 2017-05-11 ENCOUNTER — Ambulatory Visit (INDEPENDENT_AMBULATORY_CARE_PROVIDER_SITE_OTHER): Payer: 59 | Admitting: Neurology

## 2017-05-11 ENCOUNTER — Encounter: Payer: Self-pay | Admitting: Neurology

## 2017-05-11 VITALS — BP 121/78 | HR 71 | Ht 72.0 in | Wt 210.0 lb

## 2017-05-11 DIAGNOSIS — R569 Unspecified convulsions: Secondary | ICD-10-CM | POA: Diagnosis not present

## 2017-05-11 DIAGNOSIS — H532 Diplopia: Secondary | ICD-10-CM | POA: Diagnosis not present

## 2017-05-11 NOTE — Progress Notes (Signed)
PATIENT: Colin Roth: 14-Dec-1965  Chief Complaint  Patient presents with  . Follow-up    PCP: Colin Roth  . Seizures    Patient denies any recent seizure activity.   . Tremors    His eye doctor told him he had a tremor in his leg     HISTORICAL  Colin Roth 51 yo RH male, seen in refer by his endocrinologist Dr.  Dorisann Roth in May 08 2015 for seizure in May 2016, his primary care physician is Colin Roth  He had insulin-dependent diabetes, using levemir 60 units daily, Humalog KWIKPEN 10-16 dependent on calorie count before lunch, and dinner.  In May 2016, he [place], had his regular Humalog KWIKPEN  injection around 6 PM before dinner, had desert at 7 PM, he gave himself extra insulin shots, he went to bed around 10 PM, around 2 AM in the morning, his wife noticed him shaking the bed, generalized tonic-clonic seizure activity, paramedic was called, glucose was 40, he was not taken to the hospital, his symptoms quickly improved after his glucose level recovered.  He denies history of seizure, works as a Radio broadcast assistant system, he drinks 1-2 beers daily  Update November 06 2015: He has been doing very well, no recurrent seizure, his diabetes under excellent control,  We have personally reviewed MRI of the brain in October 2016, single isolated deep right frontal white matter lesion, no contrast enhancement, modrivingst consistent with chronic small vessel disease,  EEG was normal  UPDATE May 11 2017: He was seen by optometrist recently, he complains of 6 weeks history of intermittent horizontal double vision, it can happen in any gaze direction, lasting for 5 minutes, he denies diplopia, no dysarthria, no dysphagia, no limb muscle weakness,  His diabetes overall is under good control, occasionally hypoglycemia episode, he denies dizziness,  REVIEW OF SYSTEMS: Full 14 system review of systems  performed and notable only for weight gain, fatigue, urination problem, flushing, joints pain, headaches, seizure, decreased energy  ALLERGIES: No Known Allergies  HOME MEDICATIONS: Current Outpatient Prescriptions  Medication Sig Dispense Refill  . aspirin-acetaminophen-caffeine (EXCEDRIN MIGRAINE) 250-250-65 MG per tablet Take 1 tablet by mouth every 6 (six) hours as needed for headache.    . Insulin Detemir (LEVEMIR) 100 UNIT/ML Pen Inject 60 Units into the skin daily.    . insulin lispro (HUMALOG KWIKPEN) 100 UNIT/ML KiwkPen Inject 10-16 Units into the skin 2 (two) times daily.        PAST MEDICAL HISTORY: Past Medical History:  Diagnosis Date  . Diabetes mellitus    type I  . Former smoker   . Migraine   . Renal disorder    kidney stones  . Seizure (HCC)     PAST SURGICAL HISTORY: Past Surgical History:  Procedure Laterality Date  . JOINT REPLACEMENT     LEFT SHOULDER    FAMILY HISTORY: Family History  Problem Relation Age of Onset  . Thyroid disease Mother   . Heart disease Maternal Grandfather   . Hypertension Paternal Grandmother     SOCIAL HISTORY:  Social History   Social History  . Marital status: Married    Spouse name: Colin Roth  . Number of children: 1  . Years of education: 9   Occupational History  . Project Radio broadcast assistant   Social History Main Topics  . Smoking status: Former Smoker  Packs/day: 0.00    Types: Cigarettes    Quit date: 11/01/2010  . Smokeless tobacco: Never Used  . Alcohol use 0.0 oz/week     Comment: 1-2 beers per day  . Drug use: No  . 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 Not on file   Social History Narrative   Describes wife's health as "poor."   Right-handed.   Lives at home with wife.   2 cups caffeine daily.     PHYSICAL EXAM   Vitals:   05/11/17 1529  BP: 121/78  Pulse: 71  Weight: 210 lb (95.3 kg)    Height: 6' (1.829 m)    Not recorded      Body mass index is 28.48 kg/m.  PHYSICAL EXAMNIATION:  Gen: NAD, conversant, well nourised, obese, well groomed                     Cardiovascular: Regular rate rhythm, no peripheral edema, warm, nontender. Eyes: Conjunctivae clear without exudates or hemorrhage Neck: Supple, no carotid bruise. Pulmonary: Clear to auscultation bilaterally   NEUROLOGICAL EXAM:  MENTAL STATUS: Speech:    Speech is normal; fluent and spontaneous with normal comprehension.  Cognition:     Orientation to time, place and person     Normal recent and remote memory     Normal Attention span and concentration     Normal Language, naming, repeating,spontaneous speech     Fund of knowledge   CRANIAL NERVES: CN II: Visual fields are full to confrontation. Pupils are round equal and briskly reactive to light. CN III, IV, VI: extraocular movement are normal. No ptosis, bilateral exophoria . CN V: Facial sensation is intact to pinprick in all 3 divisions bilaterally. Corneal responses are intact.  CN VII: Face is symmetric with normal eye closure and smile. CN VIII: Hearing is normal to rubbing fingers CN IX, X: Palate elevates symmetrically. Phonation is normal. CN XI: Head turning and shoulder shrug are intact CN XII: Tongue is midline with normal movements and no atrophy.  MOTOR: There is no pronator drift of out-stretched arms. Muscle bulk and tone are normal. Muscle strength is normal.  REFLEXES: Reflexes are 2+ and symmetric at the biceps, triceps, knees, and ankles. Plantar responses are flexor.  SENSORY: Intact to light touch, pinprick, position sense, and vibration sense are intact in fingers and toes.  COORDINATION: Rapid alternating movements and fine finger movements are intact. There is no dysmetria on finger-to-nose and heel-knee-shin.    GAIT/STANCE: Posture is normal. Gait is steady with normal steps, base, arm swing, and turning. Heel  and toe walking are normal. Tandem gait is normal.  Romberg is absent.   DIAGNOSTIC DATA (LABS, IMAGING, TESTING) - I reviewed patient records, labs, notes, testing and imaging myself where available.   ASSESSMENT AND PLAN  Colin Roth is a 51 y.o. male   Insulin-dependent diabetes Nocturnal seizure following hypoglycemia episodes, documented glucose was 40 in May 2016 Abnormal MRI of brain  Right frontal single isolated white matter lesion most consistent with small vessel disease  I have advised him increase water intake, aspirin 81 mg daily New onset intermittent horizontal diplopia  Laboratory evaluations, myasthenia gravis panel, inflammatory markers.   Levert Feinstein, M.D. Ph.D.  [facility] [address] [place], [place] [address] Ph: [phone] Fax: [phone]  NG:[id number] Talmage Nap, MD, Colin Roth

## 2017-05-18 LAB — RPR: RPR Ser Ql: NONREACTIVE

## 2017-05-18 LAB — THYROID PANEL WITH TSH
FREE THYROXINE INDEX: 2.4 (ref 1.2–4.9)
T3 UPTAKE RATIO: 31 % (ref 24–39)
T4 TOTAL: 7.9 ug/dL (ref 4.5–12.0)
TSH: 0.444 u[IU]/mL — ABNORMAL LOW (ref 0.450–4.500)

## 2017-05-18 LAB — C-REACTIVE PROTEIN: CRP: 1.3 mg/L (ref 0.0–4.9)

## 2017-05-18 LAB — VITAMIN B12: Vitamin B-12: 507 pg/mL (ref 232–1245)

## 2017-05-18 LAB — MYASTHENIA GRAVIS FULL PANEL
ACETYLCHOL BLOCK AB: 16 % (ref 0–25)
AChR Binding Ab, Serum: <0.03 nmol/L (ref 0.00–0.24)
ANTI-STRIATION ABS: NEGATIVE

## 2017-05-18 LAB — ANA W/REFLEX: ANA: NEGATIVE

## 2017-05-18 LAB — SEDIMENTATION RATE: Sed Rate: 3 mm/hr (ref 0–30)

## 2017-05-25 ENCOUNTER — Telehealth: Payer: Self-pay | Admitting: Neurology

## 2017-05-25 NOTE — Telephone Encounter (Signed)
Per Dr. Terrace ArabiaYan, no significant abnormalities on labs.  His wife is aware.  Dr. Terrace ArabiaYan also sent a mychart message.

## 2017-05-25 NOTE — Telephone Encounter (Signed)
Pt's wife request labs results

## 2017-06-09 ENCOUNTER — Encounter: Payer: Self-pay | Admitting: Internal Medicine

## 2017-07-27 ENCOUNTER — Telehealth: Payer: Self-pay | Admitting: Neurology

## 2017-07-27 DIAGNOSIS — H532 Diplopia: Secondary | ICD-10-CM

## 2017-07-27 NOTE — Telephone Encounter (Signed)
Pt's wife called he is still having double vision. She said at last OV it was discussed having a CT if symptoms still occurred. Please call to advise.

## 2017-07-27 NOTE — Telephone Encounter (Signed)
I called the patient, talk with the wife.  The patient has had ongoing episodes of double vision, the blood work was unremarkable.  I will set the patient up for MRI evaluation of the brain.

## 2017-07-29 ENCOUNTER — Ambulatory Visit: Payer: Self-pay | Admitting: Family Medicine

## 2017-07-29 ENCOUNTER — Encounter: Payer: Self-pay | Admitting: Family Medicine

## 2017-07-29 ENCOUNTER — Ambulatory Visit: Payer: 59 | Admitting: Family Medicine

## 2017-07-29 VITALS — BP 130/70 | HR 83 | Resp 16 | Wt 207.2 lb

## 2017-07-29 DIAGNOSIS — L089 Local infection of the skin and subcutaneous tissue, unspecified: Secondary | ICD-10-CM

## 2017-07-29 DIAGNOSIS — L723 Sebaceous cyst: Secondary | ICD-10-CM | POA: Diagnosis not present

## 2017-07-29 NOTE — Progress Notes (Signed)
   Subjective:    Patient ID: Colin Roth, male    DOB: Sep 21, 1965, 51 y.o.   MRN: [id number]  HPI He has a lesion on his mid left back area that is been slowly becoming more more painful and red.  His wife finally convinced him to come in.   Review of Systems     Objective:   Physical Exam Alert and in no distress.  3 cm round red slightly tender lesion is noted on the mid left back area.       Assessment & Plan:  Infected sebaceous cyst The area was injected with Xylocaine and epinephrine a 3 cm incision was made and purulent material with cyst sac was expressed from the lesion.  The wound was cleaned extensively with gauze.  No other cyst sac lesions were noted.  It was packed with iodoform gauze.  He will remove the packing in several days and irrigate the area several times per day.  Call if further trouble.  He was comfortable with that.  His wife is a Engineer, civil (consulting)nurse.

## 2017-08-10 ENCOUNTER — Other Ambulatory Visit: Payer: Self-pay | Admitting: Neurology

## 2017-08-14 ENCOUNTER — Other Ambulatory Visit: Payer: Managed Care, Other (non HMO)

## 2017-08-16 ENCOUNTER — Telehealth: Payer: Self-pay | Admitting: Neurology

## 2017-08-16 NOTE — Telephone Encounter (Signed)
Pt's wife called she has c/a the MRI per pt's request. He is working out of town, they do not have the copay at this time and his symptoms have dissipated.  FYI

## 2017-08-16 NOTE — Telephone Encounter (Signed)
Noted  

## 2017-08-18 ENCOUNTER — Other Ambulatory Visit: Payer: Managed Care, Other (non HMO)

## 2018-10-23 IMAGING — CR DG KNEE COMPLETE 4+V*R*
4 series · 4 of 4 positions shown · non-contrast
Comparison: None.

CLINICAL DATA: Knee pain, no known injury, initial encounter

EXAM:
RIGHT KNEE - COMPLETE 4+ VIEW

[t knee ap right]
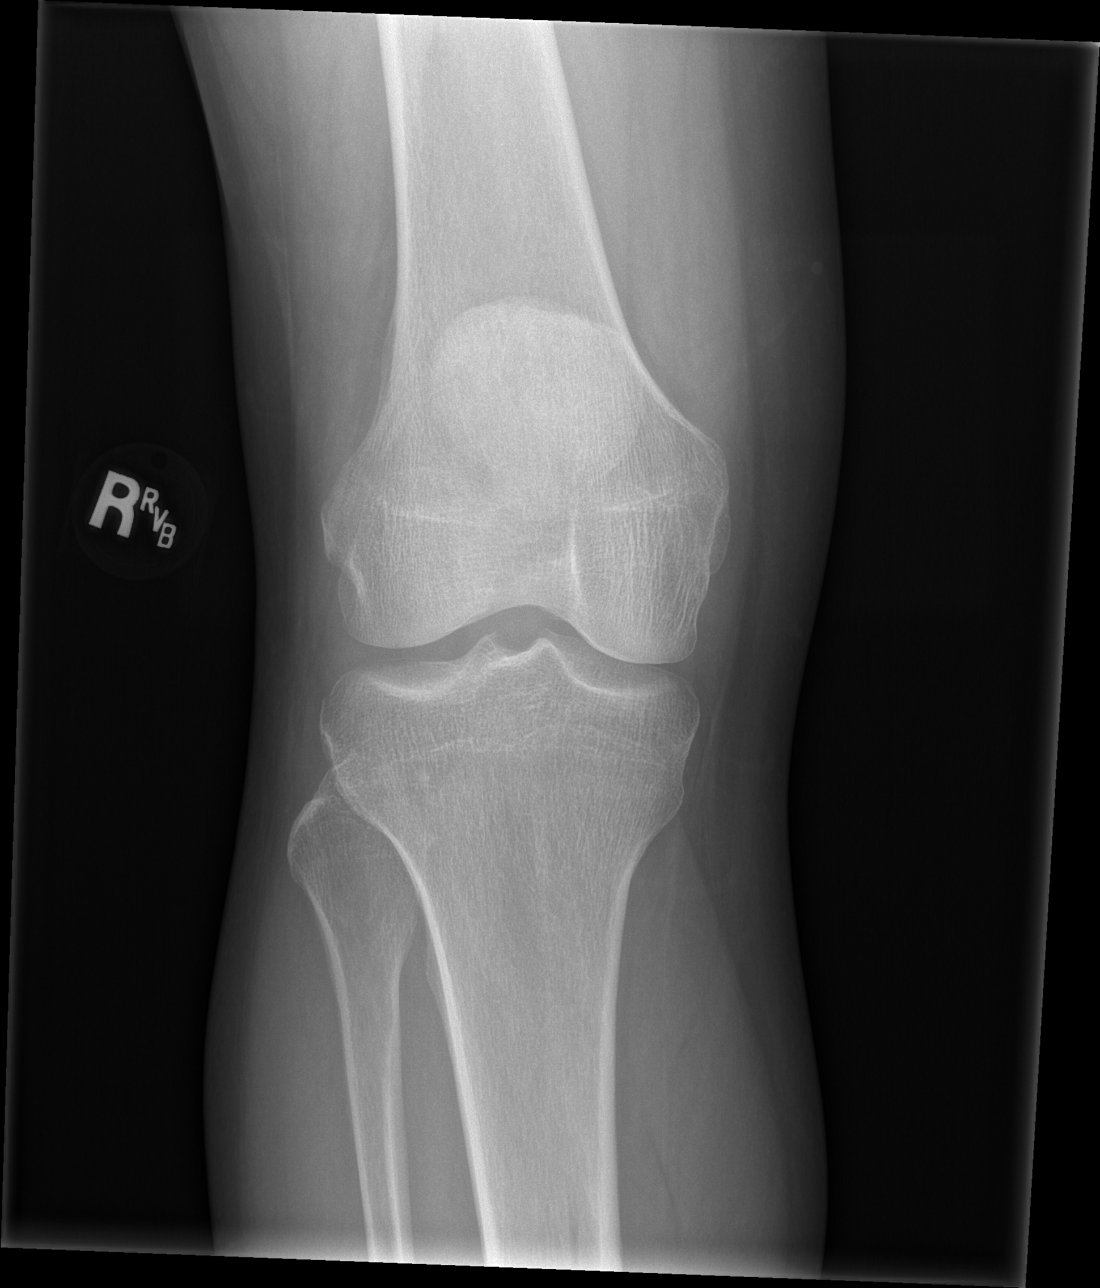

[t knee obl right (1 of 2)]
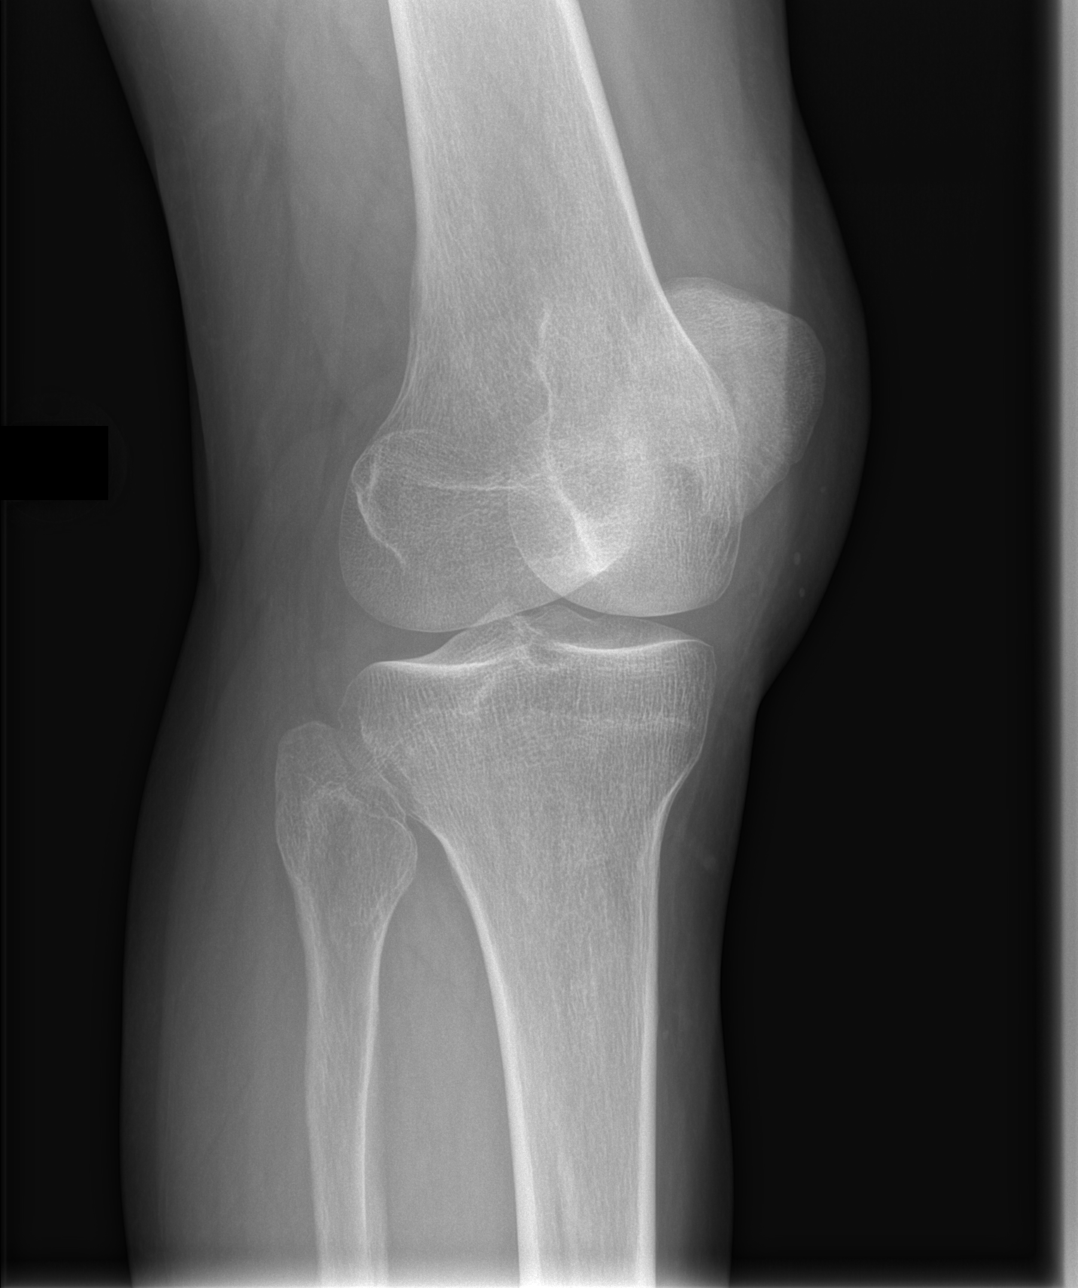

[t knee obl right (2 of 2)]
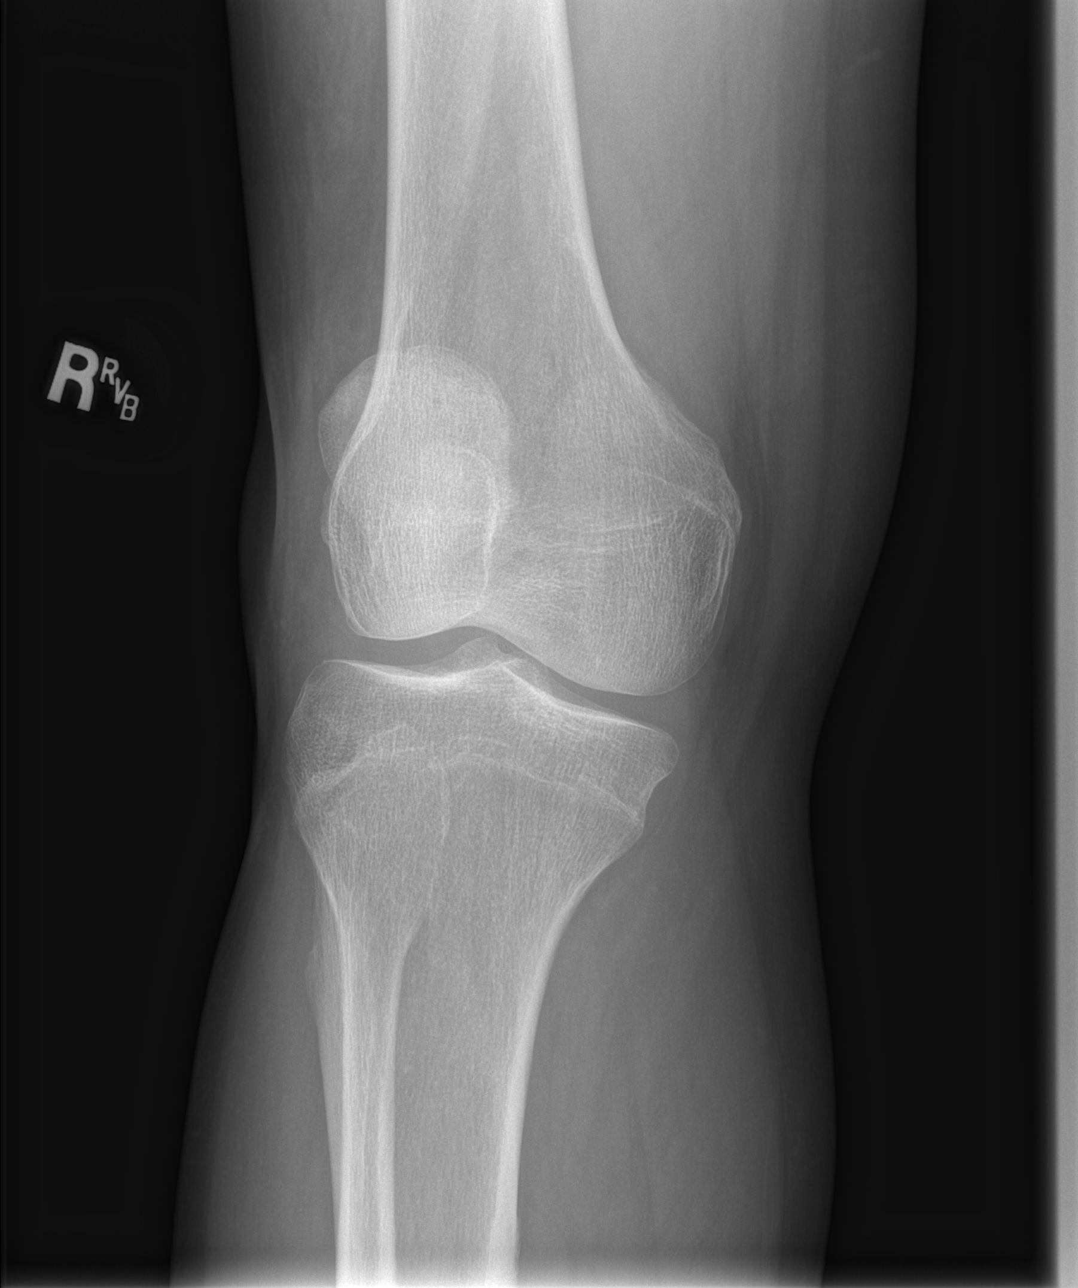

[t knee lat right]
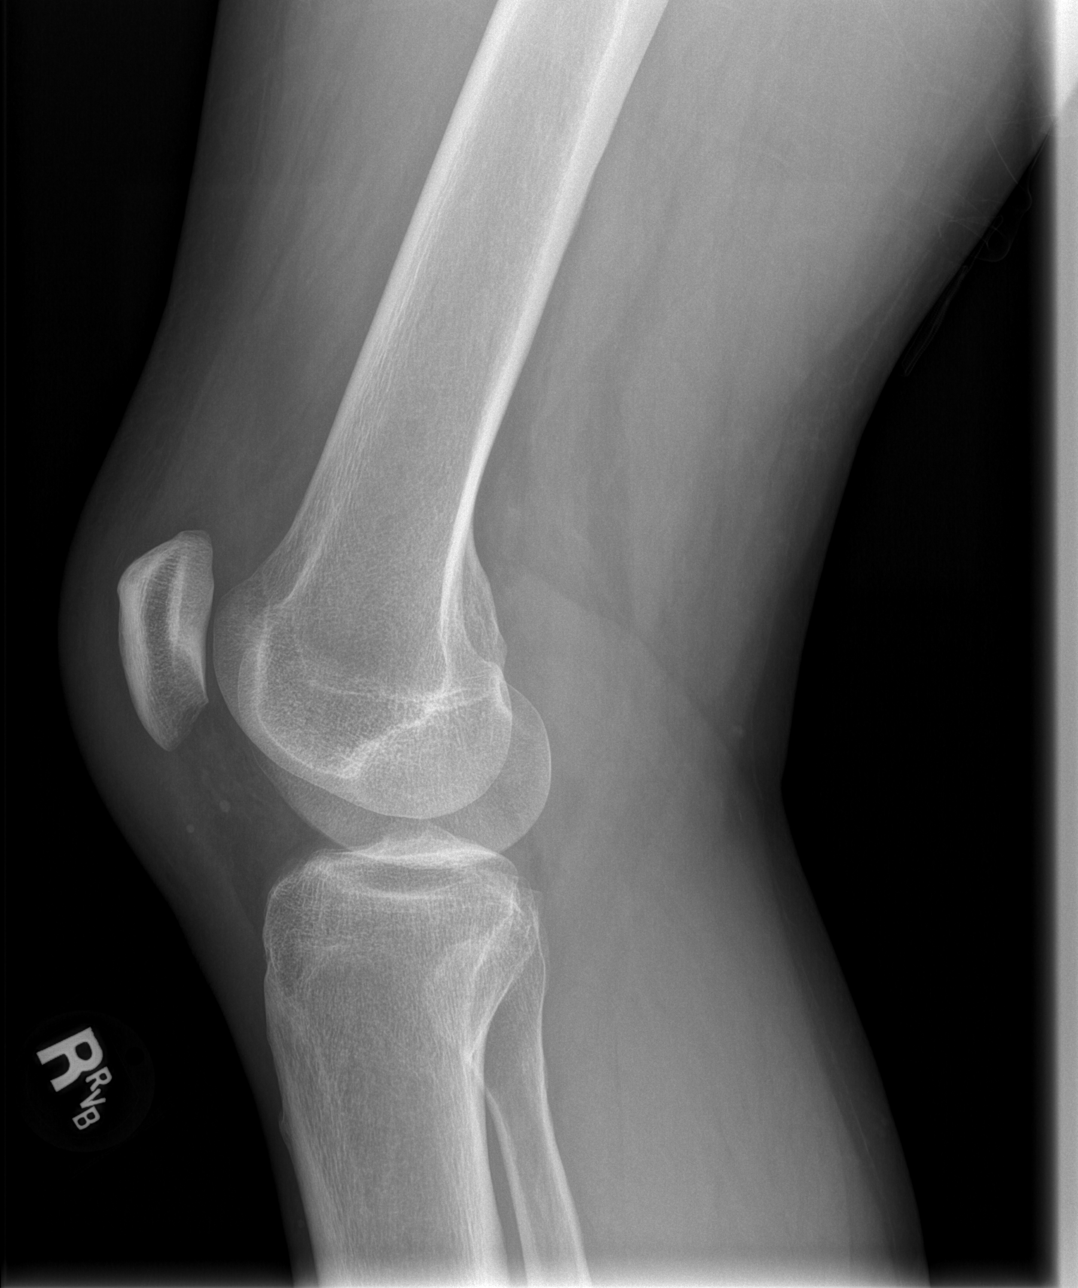

[4 of 4 positions shown; findings below may reference images not displayed]

FINDINGS: No acute fracture or dislocation is noted. Prepatellar soft tissue
swelling is seen. No joint effusion is noted.
IMPRESSION: Mild soft tissue swelling is noted. No other focal abnormality is
seen.

## 2019-03-16 ENCOUNTER — Other Ambulatory Visit: Payer: Self-pay | Admitting: *Deleted

## 2019-03-16 DIAGNOSIS — Z20822 Contact with and (suspected) exposure to covid-19: Secondary | ICD-10-CM

## 2019-03-18 LAB — NOVEL CORONAVIRUS, NAA: SARS-CoV-2, NAA: NOT DETECTED

## 2019-04-17 DIAGNOSIS — E109 Type 1 diabetes mellitus without complications: Secondary | ICD-10-CM | POA: Diagnosis not present

## 2019-04-17 DIAGNOSIS — E1165 Type 2 diabetes mellitus with hyperglycemia: Secondary | ICD-10-CM | POA: Diagnosis not present

## 2019-04-30 DIAGNOSIS — E7800 Pure hypercholesterolemia, unspecified: Secondary | ICD-10-CM | POA: Diagnosis not present

## 2019-04-30 DIAGNOSIS — E109 Type 1 diabetes mellitus without complications: Secondary | ICD-10-CM | POA: Diagnosis not present

## 2019-05-07 DIAGNOSIS — E7800 Pure hypercholesterolemia, unspecified: Secondary | ICD-10-CM | POA: Diagnosis not present

## 2019-05-07 DIAGNOSIS — R809 Proteinuria, unspecified: Secondary | ICD-10-CM | POA: Diagnosis not present

## 2019-05-07 DIAGNOSIS — Z23 Encounter for immunization: Secondary | ICD-10-CM | POA: Diagnosis not present

## 2019-05-07 DIAGNOSIS — E109 Type 1 diabetes mellitus without complications: Secondary | ICD-10-CM | POA: Diagnosis not present

## 2019-05-07 DIAGNOSIS — Z9641 Presence of insulin pump (external) (internal): Secondary | ICD-10-CM | POA: Diagnosis not present

## 2019-05-30 DIAGNOSIS — Z794 Long term (current) use of insulin: Secondary | ICD-10-CM | POA: Diagnosis not present

## 2019-05-30 DIAGNOSIS — E109 Type 1 diabetes mellitus without complications: Secondary | ICD-10-CM | POA: Diagnosis not present

## 2019-06-10 DIAGNOSIS — K047 Periapical abscess without sinus: Secondary | ICD-10-CM | POA: Diagnosis not present

## 2019-07-02 DIAGNOSIS — Z794 Long term (current) use of insulin: Secondary | ICD-10-CM | POA: Diagnosis not present

## 2019-07-02 DIAGNOSIS — E109 Type 1 diabetes mellitus without complications: Secondary | ICD-10-CM | POA: Diagnosis not present

## 2019-07-12 DIAGNOSIS — Z794 Long term (current) use of insulin: Secondary | ICD-10-CM | POA: Diagnosis not present

## 2019-07-12 DIAGNOSIS — E109 Type 1 diabetes mellitus without complications: Secondary | ICD-10-CM | POA: Diagnosis not present

## 2019-07-26 DIAGNOSIS — Z794 Long term (current) use of insulin: Secondary | ICD-10-CM | POA: Diagnosis not present

## 2019-07-26 DIAGNOSIS — E109 Type 1 diabetes mellitus without complications: Secondary | ICD-10-CM | POA: Diagnosis not present

## 2019-08-27 DIAGNOSIS — Z794 Long term (current) use of insulin: Secondary | ICD-10-CM | POA: Diagnosis not present

## 2019-08-27 DIAGNOSIS — E109 Type 1 diabetes mellitus without complications: Secondary | ICD-10-CM | POA: Diagnosis not present

## 2019-09-17 DIAGNOSIS — E7800 Pure hypercholesterolemia, unspecified: Secondary | ICD-10-CM | POA: Diagnosis not present

## 2019-09-28 DIAGNOSIS — E109 Type 1 diabetes mellitus without complications: Secondary | ICD-10-CM | POA: Diagnosis not present

## 2019-09-28 DIAGNOSIS — Z794 Long term (current) use of insulin: Secondary | ICD-10-CM | POA: Diagnosis not present

## 2019-10-04 DIAGNOSIS — E113293 Type 2 diabetes mellitus with mild nonproliferative diabetic retinopathy without macular edema, bilateral: Secondary | ICD-10-CM | POA: Diagnosis not present

## 2019-10-04 LAB — HM DIABETES EYE EXAM

## 2019-10-05 ENCOUNTER — Encounter: Payer: Self-pay | Admitting: Cardiovascular Disease

## 2019-10-05 ENCOUNTER — Other Ambulatory Visit: Payer: Self-pay

## 2019-10-05 ENCOUNTER — Ambulatory Visit: Payer: BC Managed Care – PPO | Admitting: Cardiovascular Disease

## 2019-10-05 VITALS — BP 115/73 | HR 75 | Temp 97.3°F | Ht 72.0 in | Wt 186.0 lb

## 2019-10-05 DIAGNOSIS — R012 Other cardiac sounds: Secondary | ICD-10-CM

## 2019-10-05 DIAGNOSIS — R072 Precordial pain: Secondary | ICD-10-CM

## 2019-10-05 MED ORDER — METOPROLOL TARTRATE 100 MG PO TABS: ORAL_TABLET | ORAL | 0 refills | Status: DC

## 2019-10-05 NOTE — Progress Notes (Signed)
Cardiology Office Note:    Date:  10/05/2019   ID:  Colin Roth, DOB June 12, 1966, MRN [id number]  PCP:  Denita Lung, MD  Cardiologist:  Sanda Klein, MD  Electrophysiologist:  None   Referring MD: Denita Lung, MD   Chief Complaint  Patient presents with  . Chest Pain    History of Present Illness:    Colin Roth is a 54 y.o. male with a hx of insulin requiring type 1 diabetes mellitus since age 44, with recent complaints of chest discomfort.  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  He has been working on a very large project out of town and was under tremendous amount of stress due to tight deadlines.  During that period he had multiple episodes of a deep dull aching sensation in the precordial area.  This occurred at rest, once waking him from sleep in the middle of the night.  The episodes also associated some sensation of heart racing, but he did not have frank dyspnea and denies diaphoresis or nausea.  The episodes did not occur during physical activity (for example by climbing ladders doing the installation).  After the project concluded that he was able to relax the chest pain has abated.  He has not had any in about a month's time.  Hogan smoked approximately a pack a day for about 25 years but quit about 10 years ago.  His paternal grandmother had heart disease around the age of 44.  Milind does not have high blood pressure and believes that his cholesterol has been okay.  His diabetes is tightly managed with an insulin pump and his most recent hemoglobin A1c was 6.4%.  He has occasional episodes of hypoglycemia during physical activity.  Few years ago he had a tonic clonic seizure related to severe nocturnal hypoglycemia after over correcting for a large meal during a party.  Holly's wife Colin Roth used to work in our office.  Past Medical History:  Diagnosis Date  . Diabetes mellitus    type I  . Former smoker   .  Migraine   . Renal disorder    kidney stones  . Seizure [facility])     Past Surgical History:  Procedure Laterality Date  . JOINT REPLACEMENT     LEFT SHOULDER    Current Medications: Current Meds  Medication Sig  . CONTOUR NEXT TEST test strip   . ibuprofen (ADVIL,MOTRIN) 200 MG tablet Take 600 mg by mouth every 6 (six) hours as needed.  Marland Kitchen NOVOLOG 100 UNIT/ML injection INJECT 50 UNITS SUBCUTANEOUSLY VIA PUMP  . [DISCONTINUED] HYDROcodone-acetaminophen (NORCO/VICODIN) 5-325 MG tablet Take 1 tablet by mouth every 8 (eight) hours as needed.  . [DISCONTINUED] Insulin Detemir (LEVEMIR) 100 UNIT/ML Pen Inject 50 Units into the skin daily.   . [DISCONTINUED] insulin lispro (HUMALOG KWIKPEN) 100 UNIT/ML KiwkPen Inject 8-16 Units into the skin 2 (two) times daily.      Allergies:   Patient has no known allergies.   Social History   Socioeconomic History  . Marital status: Married    Spouse name: Colin Roth  . Number of children: 1  . Years of education: 52  . Highest education level: Not on file  Occupational History  . Occupation: Herbalist: [employer]    Comment: Audio/Visual  Tobacco Use  . Smoking status: Former Smoker    Packs/day: 0.00    Types: Cigarettes    Quit date: 11/01/2010  Years since quitting: 8.9  . Smokeless tobacco: Never Used  Substance and Sexual Activity  . Alcohol use: Yes    Alcohol/week: 0.0 standard drinks    Comment: 1-2 beers per day  . Drug use: No  . Sexual activity: Yes    Partners: Female    Comment: Emergency planning/management officer with [employer]  Other Topics Concern  . Not on file  Social History Narrative   Describes wife's health as "poor."   Right-handed.   Lives at home with wife.   2 cups caffeine daily.   Social Determinants of Health   Financial Resource Strain:   . Difficulty of Paying Living Expenses: Not on file  Food Insecurity:   . Worried About Programme researcher, broadcasting/film/video in the Last Year: Not on file  . Ran Out of Food in the Last  Year: Not on file  Transportation Needs:   . Lack of Transportation (Medical): Not on file  . Lack of Transportation (Non-Medical): Not on file  Physical Activity:   . Days of Exercise per Week: Not on file  . Minutes of Exercise per Session: Not on file  Stress:   . Feeling of Stress : Not on file  Social Connections:   . Frequency of Communication with Friends and Family: Not on file  . Frequency of Social Gatherings with Friends and Family: Not on file  . Attends Religious Services: Not on file  . Active Member of Clubs or Organizations: Not on file  . Attends Banker Meetings: Not on file  . Marital Status: Not on file     Family History: The patient's family history includes Heart disease in his maternal grandfather; Hypertension in his paternal grandmother; Thyroid disease in his mother.  ROS:   Please see the history of present illness.     All other systems reviewed and are negative.  EKGs/Labs/Other Studies Reviewed:    The following studies were reviewed today: Labs from Dr. Willeen Roth office 09/17/2019 Total cholesterol 164, triglycerides 44, HDL 70, LDL 85 Normal liver function tests, BUN 15, creatinine 1.0, potassium 4.8, hemoglobin A1c 6.4%  EKG:  EKG is ordered today.  The ekg ordered today demonstrates NSR, normal tracing  Recent Labs: No results found for requested labs within last 8760 hours.  Recent Lipid Panel No results found for: CHOL, TRIG, HDL, CHOLHDL, VLDL, LDLCALC, LDLDIRECT  Physical Exam:    VS:  BP 115/73   Roth 75   Temp (!) 97.3 F (36.3 C)   Ht 6' (1.829 m)   Wt 186 lb (84.4 kg)   SpO2 98%   BMI 25.23 kg/m     Wt Readings from Last 3 Encounters:  10/05/19 186 lb (84.4 kg)  07/29/17 207 lb 3.2 oz (94 kg)  05/11/17 210 lb (95.3 kg)     GEN:  Well nourished, well developed in no acute distress HEENT: Normal NECK: No JVD; No carotid bruits LYMPHATICS: No lymphadenopathy CARDIAC: RRR, no murmurs, rubs, gallops.  Very  distinct mid systolic click that appears to become more evident with a Valsalva maneuver and is attenuated with handgrip heard even with provocative maneuvers. RESPIRATORY:  Clear to auscultation without rales, wheezing or rhonchi  ABDOMEN: Soft, non-tender, non-distended MUSCULOSKELETAL:  No edema; No deformity  SKIN: Warm and dry NEUROLOGIC:  Alert and oriented x 3 PSYCHIATRIC:  Normal affect   ASSESSMENT:    1. Precordial pain   2. Abnormal heart sounds    PLAN:    In order  of problems listed above:  1. Chest pain: Symptoms are atypical but could be consistent with angina pectoris.  Long history of type 1 diabetes mellitus raises concern.  Suggested coronary CT angiography to see if there are hemodynamically significant stenoses.  Findings could also have an impact on our target for his cholesterol level (if the calcium score is high I would shoot for an LDL less than 70) or for recommendation for aspirin prevention (if he has high burden of disease would probably recommend aspirin, otherwise not indicated just because he has diabetes mellitus). 2. Systolic click: We will check an echocardiogram to look for mitral valve prolapse.  This could also explain atypical chest discomfort if identified.  He does not have physical findings or symptoms that would suggest significant mitral regurgitation.   Medication Adjustments/Labs and Tests Ordered: Current medicines are reviewed at length with the patient today.  Concerns regarding medicines are outlined above.  Orders Placed This Encounter  Procedures  . CT CORONARY MORPH W/CTA COR W/SCORE W/CA W/CM &/OR WO/CM  . CT CORONARY FRACTIONAL FLOW RESERVE DATA PREP  . CT CORONARY FRACTIONAL FLOW RESERVE FLUID ANALYSIS  . Basic metabolic panel  . EKG 12-Lead  . ECHOCARDIOGRAM COMPLETE   Meds ordered this encounter  Medications  . metoprolol tartrate (LOPRESSOR) 100 MG tablet    Sig: Take within two hours of the test    Dispense:  1 tablet     Refill:  0    Patient Instructions  Medication Instructions:  No changes *If you need a refill on your cardiac medications before your next appointment, please call your pharmacy*   Lab Work: Your provider would like for you to have the following lab one week before the cardiac ct: BMET  If you have labs (blood work) drawn today and your tests are completely normal, you will receive your results only by: Marland Kitchen MyChart Message (if you have MyChart) OR . A paper copy in the mail If you have any lab test that is abnormal or we need to change your treatment, we will call you to review the results.   Testing/Procedures: Your physician has requested that you have cardiac CT. Cardiac computed tomography (CT) is a painless test that uses an x-ray machine to take clear, detailed pictures of your heart. For further information please visit [email or web address] Please follow instruction sheet as given.  Your physician has requested that you have an echocardiogram. Echocardiography is a painless test that uses sound waves to create images of your heart. It provides your doctor with information about the size and shape of your heart and how well your heart's chambers and valves are working. You may receive an ultrasound enhancing agent through an IV if needed to better visualize your heart during the echo.This procedure takes approximately one hour. There are no restrictions for this procedure. This will take place at the [address].     Follow-Up: At [facility], you and your health needs are our priority.  As part of our continuing mission to provide you with exceptional heart care, we have created designated Provider Care Teams.  These Care Teams include your primary Cardiologist (physician) and Advanced Practice Providers (APPs -  Physician Assistants and Nurse Practitioners) who all work together to provide you with the care you need, when you need it.  We recommend signing up  for the patient portal called "MyChart".  Sign up information is provided on this After Visit Summary.  MyChart is used  to connect with patients for Virtual Visits (Telemedicine).  Patients are able to view lab/test results, encounter notes, upcoming appointments, etc.  Non-urgent messages can be sent to your provider as well.   To learn more about what you can do with MyChart, go to [email or web address].    Your next appointment:   12 month(s)  The format for your next appointment:   In Person  Provider:   Thurmon Fair, MD   Other Instructions Your cardiac CT will be scheduled at one of the below locations:   [facility] [address] [place], [place] [address] [phone]  OR  [facility] [address] [place], [place] [address] [phone]  If scheduled at [facility], please arrive at the [facility] main entrance of [facility] 30 minutes prior to test start time. Proceed to the [facility] Radiology Department (first floor) to check-in and test prep.  If scheduled at [facility], please arrive 15 mins early for check-in and test prep.  Please follow these instructions carefully (unless otherwise directed):  On the Night Before the Test: . Be sure to Drink plenty of water. . Do not consume any caffeinated/decaffeinated beverages or chocolate 12 hours prior to your test. . Do not take any antihistamines 12 hours prior to your test.  On the Day of the Test: . Drink plenty of water. Do not drink any water within one hour of the test. . Do not eat any food 4 hours prior to the test. . You may take your regular medications prior to the test.  . Take metoprolol (Lopressor) two hours prior to test.       After the Test: . Drink plenty of water. . After receiving IV contrast, you may experience a mild flushed feeling. This is normal. . On occasion, you may  experience a mild rash up to 24 hours after the test. This is not dangerous. If this occurs, you can take Benadryl 25 mg and increase your fluid intake. . If you experience trouble breathing, this can be serious. If it is severe call 911 IMMEDIATELY. If it is mild, please call our office. . If you take any of these medications: Glipizide/Metformin, Avandament, Glucavance, please do not take 48 hours after completing test unless otherwise instructed.   Once we have confirmed authorization from your insurance company, we will call you to set up a date and time for your test.   For non-scheduling related questions, please contact the cardiac imaging nurse navigator should you have any questions/concerns: Rockwell [place], RN Navigator Cardiac Imaging [facility] and V[facility] Services [phone] office        Signed, Thurmon Fair, MD  10/05/2019 1:45 PM    [facility]

## 2019-10-05 NOTE — Patient Instructions (Signed)
Medication Instructions:  No changes *If you need a refill on your cardiac medications before your next appointment, please call your pharmacy*   Lab Work: Your provider would like for you to have the following lab one week before the cardiac ct: BMET  If you have labs (blood work) drawn today and your tests are completely normal, you will receive your results only by: Marland Kitchen MyChart Message (if you have MyChart) OR . A paper copy in the mail If you have any lab test that is abnormal or we need to change your treatment, we will call you to review the results.   Testing/Procedures: Your physician has requested that you have cardiac CT. Cardiac computed tomography (CT) is a painless test that uses an x-ray machine to take clear, detailed pictures of your heart. For further information please visit [email or web address] Please follow instruction sheet as given.  Your physician has requested that you have an echocardiogram. Echocardiography is a painless test that uses sound waves to create images of your heart. It provides your doctor with information about the size and shape of your heart and how well your heart's chambers and valves are working. You may receive an ultrasound enhancing agent through an IV if needed to better visualize your heart during the echo.This procedure takes approximately one hour. There are no restrictions for this procedure. This will take place at the [address].     Follow-Up: At [facility], you and your health needs are our priority.  As part of our continuing mission to provide you with exceptional heart care, we have created designated Provider Care Teams.  These Care Teams include your primary Cardiologist (physician) and Advanced Practice Providers (APPs -  Physician Assistants and Nurse Practitioners) who all work together to provide you with the care you need, when you need it.  We recommend signing up for the patient portal called "MyChart".   Sign up information is provided on this After Visit Summary.  MyChart is used to connect with patients for Virtual Visits (Telemedicine).  Patients are able to view lab/test results, encounter notes, upcoming appointments, etc.  Non-urgent messages can be sent to your provider as well.   To learn more about what you can do with MyChart, go to [email or web address].    Your next appointment:   12 month(s)  The format for your next appointment:   In Person  Provider:   Thurmon Fair, MD   Other Instructions Your cardiac CT will be scheduled at one of the below locations:   [facility] [address] [place], [place] [address] [phone]  OR  [facility] [address] [place], [place] [address] [phone]  If scheduled at [facility], please arrive at the [facility] main entrance of [facility] 30 minutes prior to test start time. Proceed to the [facility] Radiology Department (first floor) to check-in and test prep.  If scheduled at [facility], please arrive 15 mins early for check-in and test prep.  Please follow these instructions carefully (unless otherwise directed):  On the Night Before the Test: . Be sure to Drink plenty of water. . Do not consume any caffeinated/decaffeinated beverages or chocolate 12 hours prior to your test. . Do not take any antihistamines 12 hours prior to your test.  On the Day of the Test: . Drink plenty of water. Do not drink any water within one hour of the test. .  Do not eat any food 4 hours prior to the test. . You may take your regular medications prior to the test.  . Take metoprolol (Lopressor) two hours prior to test.       After the Test: . Drink plenty of water. . After receiving IV contrast, you may experience a mild flushed feeling. This is normal. . On occasion, you may experience a mild rash up to 24 hours after the  test. This is not dangerous. If this occurs, you can take Benadryl 25 mg and increase your fluid intake. . If you experience trouble breathing, this can be serious. If it is severe call 911 IMMEDIATELY. If it is mild, please call our office. . If you take any of these medications: Glipizide/Metformin, Avandament, Glucavance, please do not take 48 hours after completing test unless otherwise instructed.   Once we have confirmed authorization from your insurance company, we will call you to set up a date and time for your test.   For non-scheduling related questions, please contact the cardiac imaging nurse navigator should you have any questions/concerns: Marchia Bond, RN Navigator Cardiac Imaging [facility] Heart and Vascular Services [phone] office

## 2019-10-07 ENCOUNTER — Ambulatory Visit: Payer: BC Managed Care – PPO | Attending: Internal Medicine

## 2019-10-07 DIAGNOSIS — Z23 Encounter for immunization: Secondary | ICD-10-CM

## 2019-10-07 NOTE — Progress Notes (Signed)
   Covid-19 Vaccination Clinic  Name:  Colin Roth    MRN: [id number] DOB: Apr 12, 1966  10/07/2019  [name] was observed post Covid-19 immunization for 15 minutes without incident. He was provided with Vaccine Information Sheet and instruction to access the V-Safe system.   [name] was instructed to call 911 with any severe reactions post vaccine: Marland Kitchen Difficulty breathing  . Swelling of face and throat  . A fast heartbeat  . A bad rash all over body  . Dizziness and weakness   Immunizations Administered    Name Date Dose VIS Date Route   Pfizer COVID-19 Vaccine 10/07/2019  5:00 PM 0.3 mL 07/13/2019 Intramuscular   Manufacturer: [employer], [employer]   Lot: [id number]   NDC: [address]-1

## 2019-10-22 ENCOUNTER — Ambulatory Visit (HOSPITAL_COMMUNITY): Payer: BC Managed Care – PPO | Attending: Cardiovascular Disease

## 2019-10-22 ENCOUNTER — Telehealth: Payer: Self-pay | Admitting: Cardiovascular Disease

## 2019-10-22 ENCOUNTER — Other Ambulatory Visit: Payer: Self-pay

## 2019-10-22 DIAGNOSIS — E109 Type 1 diabetes mellitus without complications: Secondary | ICD-10-CM | POA: Diagnosis not present

## 2019-10-22 DIAGNOSIS — R072 Precordial pain: Secondary | ICD-10-CM | POA: Diagnosis not present

## 2019-10-22 DIAGNOSIS — R012 Other cardiac sounds: Secondary | ICD-10-CM | POA: Insufficient documentation

## 2019-10-22 DIAGNOSIS — Z794 Long term (current) use of insulin: Secondary | ICD-10-CM | POA: Diagnosis not present

## 2019-10-22 NOTE — Telephone Encounter (Signed)
Colin Roth is calling in regards to Colin Roth's CT that is waiting on prior auth to be scheduled. She states they do not want to schedule the CT until 11/26/19 or later and just wanted to make the office aware.

## 2019-10-22 NOTE — Telephone Encounter (Signed)
The patient would like to wait until after 4/26 to have the cardiac ct wince they will be traveling. Message forwarded to scheduling.

## 2019-10-24 ENCOUNTER — Encounter: Payer: Self-pay | Admitting: *Deleted

## 2019-11-05 ENCOUNTER — Telehealth (HOSPITAL_COMMUNITY): Payer: Self-pay | Admitting: Emergency Medicine

## 2019-11-05 ENCOUNTER — Encounter (HOSPITAL_COMMUNITY): Payer: Self-pay

## 2019-11-05 DIAGNOSIS — E7800 Pure hypercholesterolemia, unspecified: Secondary | ICD-10-CM | POA: Diagnosis not present

## 2019-11-05 DIAGNOSIS — E109 Type 1 diabetes mellitus without complications: Secondary | ICD-10-CM | POA: Diagnosis not present

## 2019-11-05 NOTE — Telephone Encounter (Signed)
Pt returning phone call regarding upcoming cardiac imaging study; pt verbalizes understanding of appt date/time, parking situation and where to check in, pre-test NPO status and medications ordered, and verified current allergies; name and call back number provided for further questions should they arise Tiondra Fang RN Navigator Cardiac Imaging [facility] [facility] [phone] office [phone] cell   

## 2019-11-05 NOTE — Telephone Encounter (Signed)
Attempted to call patient regarding upcoming cardiac CT appointment. °Left message on voicemail with name and callback number °Keyante Durio RN Navigator Cardiac Imaging °[place] [facility] °[phone] Office °[phone] Cell ° °

## 2019-11-06 ENCOUNTER — Other Ambulatory Visit: Payer: Self-pay

## 2019-11-06 ENCOUNTER — Ambulatory Visit: Payer: BC Managed Care – PPO | Attending: Internal Medicine

## 2019-11-06 ENCOUNTER — Ambulatory Visit (HOSPITAL_COMMUNITY)
Admission: RE | Admit: 2019-11-06 | Discharge: 2019-11-06 | Disposition: A | Discharge status: Home / Self Care | Payer: BC Managed Care – PPO | Source: Ambulatory Visit | Attending: Cardiovascular Disease | Admitting: Cardiovascular Disease

## 2019-11-06 ENCOUNTER — Encounter (HOSPITAL_COMMUNITY): Payer: Self-pay

## 2019-11-06 DIAGNOSIS — R072 Precordial pain: Secondary | ICD-10-CM

## 2019-11-06 DIAGNOSIS — Z23 Encounter for immunization: Secondary | ICD-10-CM

## 2019-11-06 MED ORDER — NITROGLYCERIN 0.4 MG SL SUBL
0.8000 mg | SUBLINGUAL_TABLET | Freq: Once | SUBLINGUAL | Status: AC
  Administered 2019-11-06: 0.8 mg via SUBLINGUAL

## 2019-11-06 MED ORDER — NITROGLYCERIN 0.4 MG SL SUBL
SUBLINGUAL_TABLET | SUBLINGUAL | Status: AC
  Filled 2019-11-06: qty 2

## 2019-11-06 MED ORDER — IOHEXOL 350 MG/ML SOLN
80.0000 mL | Freq: Once | INTRAVENOUS | Status: AC | PRN
  Administered 2019-11-06: 16:00:00 80 mL via INTRAVENOUS

## 2019-11-06 NOTE — Progress Notes (Signed)
   Covid-19 Vaccination Clinic  Name:  Colin Roth    MRN: [id number] DOB: July 11, 1966  11/06/2019  [name] was observed post Covid-19 immunization for 15 minutes without incident. He was provided with Vaccine Information Sheet and instruction to access the V-Safe system.   [name] was instructed to call 911 with any severe reactions post vaccine: Marland Kitchen Difficulty breathing  . Swelling of face and throat  . A fast heartbeat  . A bad rash all over body  . Dizziness and weakness   Immunizations Administered    Name Date Dose VIS Date Route   Pfizer COVID-19 Vaccine 11/06/2019  2:32 PM 0.3 mL 07/13/2019 Intramuscular   Manufacturer: [employer], [employer]   Lot: [id number]   NDC: [address]-1

## 2019-11-07 ENCOUNTER — Ambulatory Visit (HOSPITAL_COMMUNITY)
Admission: RE | Admit: 2019-11-07 | Discharge: 2019-11-07 | Disposition: A | Discharge status: Home / Self Care | Payer: BC Managed Care – PPO | Source: Ambulatory Visit | Attending: Cardiovascular Disease | Admitting: Cardiovascular Disease

## 2019-11-07 DIAGNOSIS — R072 Precordial pain: Secondary | ICD-10-CM

## 2019-11-07 LAB — POCT I-STAT CREATININE: Creatinine, Ser: 0.8 mg/dL (ref 0.61–1.24)

## 2019-11-12 DIAGNOSIS — Z9641 Presence of insulin pump (external) (internal): Secondary | ICD-10-CM | POA: Diagnosis not present

## 2019-11-12 DIAGNOSIS — E109 Type 1 diabetes mellitus without complications: Secondary | ICD-10-CM | POA: Diagnosis not present

## 2019-11-12 DIAGNOSIS — R809 Proteinuria, unspecified: Secondary | ICD-10-CM | POA: Diagnosis not present

## 2019-11-12 DIAGNOSIS — E7800 Pure hypercholesterolemia, unspecified: Secondary | ICD-10-CM | POA: Diagnosis not present

## 2019-11-19 ENCOUNTER — Other Ambulatory Visit: Payer: Self-pay

## 2019-11-19 ENCOUNTER — Encounter: Payer: Self-pay | Admitting: Family Medicine

## 2019-11-19 ENCOUNTER — Telehealth: Payer: Self-pay | Admitting: Cardiovascular Disease

## 2019-11-19 ENCOUNTER — Ambulatory Visit: Payer: BC Managed Care – PPO | Admitting: Family Medicine

## 2019-11-19 VITALS — BP 130/82 | HR 77 | Temp 97.7°F | Ht 70.25 in | Wt 181.6 lb

## 2019-11-19 DIAGNOSIS — Z0000 Encounter for general adult medical examination without abnormal findings: Secondary | ICD-10-CM

## 2019-11-19 DIAGNOSIS — D72829 Elevated white blood cell count, unspecified: Secondary | ICD-10-CM | POA: Diagnosis not present

## 2019-11-19 DIAGNOSIS — Z1211 Encounter for screening for malignant neoplasm of colon: Secondary | ICD-10-CM | POA: Diagnosis not present

## 2019-11-19 DIAGNOSIS — Z794 Long term (current) use of insulin: Secondary | ICD-10-CM

## 2019-11-19 DIAGNOSIS — Z23 Encounter for immunization: Secondary | ICD-10-CM

## 2019-11-19 DIAGNOSIS — E119 Type 2 diabetes mellitus without complications: Secondary | ICD-10-CM | POA: Diagnosis not present

## 2019-11-19 DIAGNOSIS — L821 Other seborrheic keratosis: Secondary | ICD-10-CM | POA: Diagnosis not present

## 2019-11-19 DIAGNOSIS — F439 Reaction to severe stress, unspecified: Secondary | ICD-10-CM

## 2019-11-19 DIAGNOSIS — D235 Other benign neoplasm of skin of trunk: Secondary | ICD-10-CM | POA: Diagnosis not present

## 2019-11-19 DIAGNOSIS — D225 Melanocytic nevi of trunk: Secondary | ICD-10-CM | POA: Diagnosis not present

## 2019-11-19 DIAGNOSIS — M720 Palmar fascial fibromatosis [Dupuytren]: Secondary | ICD-10-CM | POA: Diagnosis not present

## 2019-11-19 DIAGNOSIS — K219 Gastro-esophageal reflux disease without esophagitis: Secondary | ICD-10-CM

## 2019-11-19 DIAGNOSIS — L720 Epidermal cyst: Secondary | ICD-10-CM | POA: Diagnosis not present

## 2019-11-19 DIAGNOSIS — I700 Atherosclerosis of aorta: Secondary | ICD-10-CM

## 2019-11-19 LAB — CBC WITH DIFFERENTIAL/PLATELET
Basophils Absolute: 0.1 10*3/uL (ref 0.0–0.2)
Basos: 1 %
EOS (ABSOLUTE): 0.1 10*3/uL (ref 0.0–0.4)
Eos: 1 %
Hematocrit: 41.8 % (ref 37.5–51.0)
Hemoglobin: 14.3 g/dL (ref 13.0–17.7)
Immature Grans (Abs): 0 10*3/uL (ref 0.0–0.1)
Immature Granulocytes: 0 %
Lymphocytes Absolute: 1.9 10*3/uL (ref 0.7–3.1)
Lymphs: 35 %
MCH: 31 pg (ref 26.6–33.0)
MCHC: 34.2 g/dL (ref 31.5–35.7)
MCV: 91 fL (ref 79–97)
Monocytes Absolute: 0.5 10*3/uL (ref 0.1–0.9)
Monocytes: 9 %
Neutrophils Absolute: 3 10*3/uL (ref 1.4–7.0)
Neutrophils: 54 %
Platelets: 233 10*3/uL (ref 150–450)
RBC: 4.62 x10E6/uL (ref 4.14–5.80)
RDW: 12.7 % (ref 11.6–15.4)
WBC: 5.5 10*3/uL (ref 3.4–10.8)

## 2019-11-19 LAB — POCT URINALYSIS DIP (PROADVANTAGE DEVICE)
Bilirubin, UA: NEGATIVE
Blood, UA: NEGATIVE
Glucose, UA: NEGATIVE mg/dL
Ketones, POC UA: NEGATIVE mg/dL
Leukocytes, UA: NEGATIVE
Nitrite, UA: NEGATIVE
Protein Ur, POC: NEGATIVE mg/dL
Specific Gravity, Urine: 1.01
Urobilinogen, Ur: 0.2
pH, UA: 6 (ref 5.0–8.0)

## 2019-11-19 MED ORDER — ATORVASTATIN CALCIUM 20 MG PO TABS: 20.0000 mg | ORAL_TABLET | Freq: Every day | ORAL | 3 refills | Status: DC

## 2019-11-19 MED ORDER — LOSARTAN POTASSIUM 25 MG PO TABS: 25.0000 mg | ORAL_TABLET | Freq: Every day | ORAL | 3 refills | Status: DC

## 2019-11-19 NOTE — Telephone Encounter (Signed)
Patient's wife states she is requesting to discuss results from CT completed on 11/07/19 as well as echocardiogram results from echocardiogram completed on 10/22/19. Please call.

## 2019-11-19 NOTE — Progress Notes (Signed)
   Subjective:    Patient ID: Colin Roth, male    DOB: 26-Mar-1966, 54 y.o.   MRN: [id number]  HPI He is here for complete examination.  He has been recently seen by cardiology.  Recent cardiac CT did show evidence of atherosclerosis as well as a calcium score of 208.  He will have follow-up with cardiology concerning this.  He sees Dr. Lisabeth Devoid for diabetes and now has a pump which seems to be working well.  Recent hemoglobin A1c is 6.8.  He has seen ophthalmology and apparently Dr. Lisabeth Devoid dose check his feet.  Presently he is not on an ACE/ARB or statin.  He has had no difficulty with his reflux.  He does plan to see dermatology for lesions on his back.  He does not smoke and usually drinks 2 beers per night to help relax. He does complain of contractures in the palms of both hands which is interfering with his ADLs.  Review of the record indicates leukocytosis.  He also did have a seizure in 2018 that was associated with low blood sugar.  His wife has pulmonary issues as well as issues with anxiety and depression.  Discussed causing difficulties with stress at home.  He has no urinary symptoms and presently he and his wife are not sexually active.  Otherwise his family and social history as well as health maintenance and immunizations was reviewed.  Review of Systems  All other systems reviewed and are negative.      Objective:   Physical Exam Alert and in no distress. Tympanic membranes and canals are normal. Pharyngeal area is normal. Neck is supple without adenopathy or thyromegaly. Cardiac exam shows a regular sinus rhythm without murmurs or gallops. Lungs are clear to auscultation.  Abdominal exam does show the pump to be present.  No hepatosplenomegaly masses or tenderness. Exam of both hands does show contractures mainly of the third and fourth flexor tendons.  Exam of his back does show evidence of seborrheic keratosis. Blood work from endocrinology was reviewed.  LDL was 80.  No CBC  was done.     Assessment & Plan:  Routine general medical examination at a health care facility  Screening for colon cancer - Plan: Cologuard  Gastroesophageal reflux disease without esophagitis  Dupuytren's contracture of both hands - Plan: Ambulatory referral to Orthopedic Surgery  Type 2 diabetes mellitus treated with insulin (HCC) - Seizure in 2018 with documented blood sugar of 40 - Plan: atorvastatin (LIPITOR) 20 MG tablet, losartan (COZAAR) 25 MG tablet  Atherosclerosis of aorta (HCC)  Need for vaccination against Streptococcus pneumoniae - Plan: Pneumococcal conjugate vaccine 13-valent  Leukocytosis, unspecified type - Plan: CBC with Differential/Platelet  Stress at home  No intervention needed for the GERD since he is not having any symptoms. I think it is now appropriate to put him on an ARB and a statin.  Discussed possible side effects of both of these medications. There is a history of leukocytosis however no information concerning that liver for follow-up on it. Discussed the need for him to discuss with wife and he needs to decompress when he comes home and asked for half an hour of quiet time.

## 2019-11-19 NOTE — Telephone Encounter (Signed)
Spoke to the patient's wife, per dpr, she calling to let Dr. Royann Shivers know that his PCP has started him on Losartan 25 mg once daily and Atorvastatin 20 mg once daily. The PCP is in epic.

## 2019-11-20 ENCOUNTER — Encounter: Payer: Self-pay | Admitting: Family Medicine

## 2019-11-20 ENCOUNTER — Telehealth: Payer: Self-pay

## 2019-11-20 NOTE — Telephone Encounter (Signed)
Error

## 2019-11-23 ENCOUNTER — Telehealth: Payer: Self-pay | Admitting: Family Medicine

## 2019-11-23 NOTE — Telephone Encounter (Signed)
Received requested records from [facility]

## 2019-11-28 DIAGNOSIS — E109 Type 1 diabetes mellitus without complications: Secondary | ICD-10-CM | POA: Diagnosis not present

## 2019-11-28 DIAGNOSIS — H269 Unspecified cataract: Secondary | ICD-10-CM | POA: Insufficient documentation

## 2019-11-28 DIAGNOSIS — E1136 Type 2 diabetes mellitus with diabetic cataract: Secondary | ICD-10-CM | POA: Insufficient documentation

## 2019-11-28 DIAGNOSIS — Z794 Long term (current) use of insulin: Secondary | ICD-10-CM | POA: Diagnosis not present

## 2019-11-29 DIAGNOSIS — E109 Type 1 diabetes mellitus without complications: Secondary | ICD-10-CM | POA: Diagnosis not present

## 2019-11-29 DIAGNOSIS — Z794 Long term (current) use of insulin: Secondary | ICD-10-CM | POA: Diagnosis not present

## 2019-12-05 DIAGNOSIS — M720 Palmar fascial fibromatosis [Dupuytren]: Secondary | ICD-10-CM | POA: Diagnosis not present

## 2019-12-10 DIAGNOSIS — Z1211 Encounter for screening for malignant neoplasm of colon: Secondary | ICD-10-CM | POA: Diagnosis not present

## 2019-12-11 LAB — COLOGUARD: Cologuard: NEGATIVE

## 2019-12-15 LAB — COLOGUARD: COLOGUARD: NEGATIVE

## 2019-12-17 NOTE — Progress Notes (Signed)
LVM for pt to call back to the office to be advised of negative cologuard. Results KH

## 2019-12-18 ENCOUNTER — Telehealth: Payer: Self-pay

## 2019-12-18 NOTE — Telephone Encounter (Signed)
Pt. Called for his lab results and I let him know his stool card results were negative.

## 2019-12-28 DIAGNOSIS — Z794 Long term (current) use of insulin: Secondary | ICD-10-CM | POA: Diagnosis not present

## 2019-12-28 DIAGNOSIS — E109 Type 1 diabetes mellitus without complications: Secondary | ICD-10-CM | POA: Diagnosis not present

## 2020-01-21 DIAGNOSIS — M720 Palmar fascial fibromatosis [Dupuytren]: Secondary | ICD-10-CM | POA: Diagnosis not present

## 2020-01-30 DIAGNOSIS — Z794 Long term (current) use of insulin: Secondary | ICD-10-CM | POA: Diagnosis not present

## 2020-01-30 DIAGNOSIS — E109 Type 1 diabetes mellitus without complications: Secondary | ICD-10-CM | POA: Diagnosis not present

## 2020-02-11 DIAGNOSIS — M720 Palmar fascial fibromatosis [Dupuytren]: Secondary | ICD-10-CM | POA: Diagnosis not present

## 2020-02-27 DIAGNOSIS — Z794 Long term (current) use of insulin: Secondary | ICD-10-CM | POA: Diagnosis not present

## 2020-02-27 DIAGNOSIS — E109 Type 1 diabetes mellitus without complications: Secondary | ICD-10-CM | POA: Diagnosis not present

## 2020-02-29 DIAGNOSIS — E109 Type 1 diabetes mellitus without complications: Secondary | ICD-10-CM | POA: Diagnosis not present

## 2020-02-29 DIAGNOSIS — Z794 Long term (current) use of insulin: Secondary | ICD-10-CM | POA: Diagnosis not present

## 2020-03-28 DIAGNOSIS — E109 Type 1 diabetes mellitus without complications: Secondary | ICD-10-CM | POA: Diagnosis not present

## 2020-03-28 DIAGNOSIS — Z794 Long term (current) use of insulin: Secondary | ICD-10-CM | POA: Diagnosis not present

## 2020-03-31 DIAGNOSIS — Z794 Long term (current) use of insulin: Secondary | ICD-10-CM | POA: Diagnosis not present

## 2020-03-31 DIAGNOSIS — E109 Type 1 diabetes mellitus without complications: Secondary | ICD-10-CM | POA: Diagnosis not present

## 2020-04-09 DIAGNOSIS — Z9641 Presence of insulin pump (external) (internal): Secondary | ICD-10-CM | POA: Diagnosis not present

## 2020-04-09 DIAGNOSIS — M720 Palmar fascial fibromatosis [Dupuytren]: Secondary | ICD-10-CM | POA: Diagnosis not present

## 2020-04-09 DIAGNOSIS — I10 Essential (primary) hypertension: Secondary | ICD-10-CM | POA: Diagnosis not present

## 2020-04-09 DIAGNOSIS — E119 Type 2 diabetes mellitus without complications: Secondary | ICD-10-CM | POA: Diagnosis not present

## 2020-04-09 DIAGNOSIS — Z87891 Personal history of nicotine dependence: Secondary | ICD-10-CM | POA: Diagnosis not present

## 2020-04-17 DIAGNOSIS — Z4889 Encounter for other specified surgical aftercare: Secondary | ICD-10-CM | POA: Diagnosis not present

## 2020-04-17 DIAGNOSIS — M79641 Pain in right hand: Secondary | ICD-10-CM | POA: Diagnosis not present

## 2020-04-17 DIAGNOSIS — M720 Palmar fascial fibromatosis [Dupuytren]: Secondary | ICD-10-CM | POA: Diagnosis not present

## 2020-04-17 DIAGNOSIS — M25641 Stiffness of right hand, not elsewhere classified: Secondary | ICD-10-CM | POA: Diagnosis not present

## 2020-04-30 DIAGNOSIS — E109 Type 1 diabetes mellitus without complications: Secondary | ICD-10-CM | POA: Diagnosis not present

## 2020-04-30 DIAGNOSIS — Z794 Long term (current) use of insulin: Secondary | ICD-10-CM | POA: Diagnosis not present

## 2020-05-01 DIAGNOSIS — Z794 Long term (current) use of insulin: Secondary | ICD-10-CM | POA: Diagnosis not present

## 2020-05-01 DIAGNOSIS — M79641 Pain in right hand: Secondary | ICD-10-CM | POA: Diagnosis not present

## 2020-05-01 DIAGNOSIS — Z4889 Encounter for other specified surgical aftercare: Secondary | ICD-10-CM | POA: Diagnosis not present

## 2020-05-01 DIAGNOSIS — M720 Palmar fascial fibromatosis [Dupuytren]: Secondary | ICD-10-CM | POA: Diagnosis not present

## 2020-05-01 DIAGNOSIS — M25641 Stiffness of right hand, not elsewhere classified: Secondary | ICD-10-CM | POA: Diagnosis not present

## 2020-05-01 DIAGNOSIS — E109 Type 1 diabetes mellitus without complications: Secondary | ICD-10-CM | POA: Diagnosis not present

## 2020-05-08 DIAGNOSIS — M720 Palmar fascial fibromatosis [Dupuytren]: Secondary | ICD-10-CM | POA: Diagnosis not present

## 2020-05-14 DIAGNOSIS — E7800 Pure hypercholesterolemia, unspecified: Secondary | ICD-10-CM | POA: Diagnosis not present

## 2020-05-14 DIAGNOSIS — R809 Proteinuria, unspecified: Secondary | ICD-10-CM | POA: Diagnosis not present

## 2020-05-14 DIAGNOSIS — Z9641 Presence of insulin pump (external) (internal): Secondary | ICD-10-CM | POA: Diagnosis not present

## 2020-05-14 DIAGNOSIS — Z23 Encounter for immunization: Secondary | ICD-10-CM | POA: Diagnosis not present

## 2020-05-14 DIAGNOSIS — E109 Type 1 diabetes mellitus without complications: Secondary | ICD-10-CM | POA: Diagnosis not present

## 2020-06-05 DIAGNOSIS — M720 Palmar fascial fibromatosis [Dupuytren]: Secondary | ICD-10-CM | POA: Diagnosis not present

## 2020-06-20 DIAGNOSIS — Z794 Long term (current) use of insulin: Secondary | ICD-10-CM | POA: Diagnosis not present

## 2020-06-20 DIAGNOSIS — E109 Type 1 diabetes mellitus without complications: Secondary | ICD-10-CM | POA: Diagnosis not present

## 2020-07-09 DIAGNOSIS — Z20822 Contact with and (suspected) exposure to covid-19: Secondary | ICD-10-CM | POA: Diagnosis not present

## 2020-07-14 ENCOUNTER — Encounter: Payer: Self-pay | Admitting: Family Medicine

## 2020-07-14 ENCOUNTER — Other Ambulatory Visit: Payer: Self-pay

## 2020-07-14 ENCOUNTER — Telehealth: Payer: Self-pay | Admitting: Family Medicine

## 2020-07-14 ENCOUNTER — Other Ambulatory Visit (INDEPENDENT_AMBULATORY_CARE_PROVIDER_SITE_OTHER): Payer: BC Managed Care – PPO

## 2020-07-14 ENCOUNTER — Telehealth (INDEPENDENT_AMBULATORY_CARE_PROVIDER_SITE_OTHER): Payer: BC Managed Care – PPO | Admitting: Family Medicine

## 2020-07-14 VITALS — BP 112/70 | HR 86 | Temp 97.1°F | Wt 178.2 lb

## 2020-07-14 DIAGNOSIS — R5383 Other fatigue: Secondary | ICD-10-CM | POA: Diagnosis not present

## 2020-07-14 DIAGNOSIS — R509 Fever, unspecified: Secondary | ICD-10-CM

## 2020-07-14 DIAGNOSIS — R059 Cough, unspecified: Secondary | ICD-10-CM

## 2020-07-14 DIAGNOSIS — R7309 Other abnormal glucose: Secondary | ICD-10-CM | POA: Diagnosis not present

## 2020-07-14 LAB — POC COVID19 BINAXNOW: SARS Coronavirus 2 Ag: NEGATIVE

## 2020-07-14 NOTE — Telephone Encounter (Signed)
Pt having virtual visit this afternoon late.  Do you want him to come by and have COVID test prior?  Also pt is wanting labs due to his fatigue.

## 2020-07-14 NOTE — Progress Notes (Signed)
   Subjective:    Patient ID: Colin Roth, male    DOB: Aug 29, 1965, 54 y.o.   MRN: [id number]  HPI He states that last Tuesday he had difficulty with fever, chills, weakness, fatigue, rhinorrhea.  He was tested the next day which was negative however his symptoms have continued.   Review of Systems     Objective:   Physical Exam Alert and in no distress. Tympanic membranes and canals are normal. Pharyngeal area is normal. Neck is supple without adenopathy or thyromegaly. Cardiac exam shows a regular sinus rhythm without murmurs or gallops. Lungs are clear to auscultation. Covid testing earlier today was negative.       Assessment & Plan:  Fever and chills - Plan: CBC with Differential/Platelet, Comprehensive metabolic panel  Fatigue, unspecified type - Plan: CBC with Differential/Platelet, Comprehensive metabolic panel Most likely his symptoms are viral in nature but I think checking blood work is not unreasonable.  He was comfortable with that.

## 2020-07-15 LAB — CBC WITH DIFFERENTIAL/PLATELET
Basophils Absolute: 0 10*3/uL (ref 0.0–0.2)
Basos: 1 %
EOS (ABSOLUTE): 0 10*3/uL (ref 0.0–0.4)
Eos: 0 %
Hematocrit: 43.2 % (ref 37.5–51.0)
Hemoglobin: 14.7 g/dL (ref 13.0–17.7)
Immature Grans (Abs): 0 10*3/uL (ref 0.0–0.1)
Immature Granulocytes: 0 %
Lymphocytes Absolute: 1.8 10*3/uL (ref 0.7–3.1)
Lymphs: 30 %
MCH: 30.8 pg (ref 26.6–33.0)
MCHC: 34 g/dL (ref 31.5–35.7)
MCV: 91 fL (ref 79–97)
Monocytes Absolute: 0.8 10*3/uL (ref 0.1–0.9)
Monocytes: 12 %
Neutrophils Absolute: 3.5 10*3/uL (ref 1.4–7.0)
Neutrophils: 57 %
Platelets: 234 10*3/uL (ref 150–450)
RBC: 4.77 x10E6/uL (ref 4.14–5.80)
RDW: 12.2 % (ref 11.6–15.4)
WBC: 6.1 10*3/uL (ref 3.4–10.8)

## 2020-07-15 LAB — COMPREHENSIVE METABOLIC PANEL
ALT: 20 IU/L (ref 0–44)
AST: 23 IU/L (ref 0–40)
Albumin/Globulin Ratio: 1.3 (ref 1.2–2.2)
Albumin: 3.8 g/dL (ref 3.8–4.9)
Alkaline Phosphatase: 57 IU/L (ref 44–121)
BUN/Creatinine Ratio: 12 (ref 9–20)
BUN: 10 mg/dL (ref 6–24)
Bilirubin Total: 0.5 mg/dL (ref 0.0–1.2)
CO2: 26 mmol/L (ref 20–29)
Calcium: 8.9 mg/dL (ref 8.7–10.2)
Chloride: 102 mmol/L (ref 96–106)
Creatinine, Ser: 0.84 mg/dL (ref 0.76–1.27)
GFR calc Af Amer: 115 mL/min/{1.73_m2} (ref >59)
GFR calc non Af Amer: 99 mL/min/{1.73_m2} (ref >59)
Globulin, Total: 3 g/dL (ref 1.5–4.5)
Glucose: 169 mg/dL — ABNORMAL HIGH (ref 65–99)
Potassium: 4.1 mmol/L (ref 3.5–5.2)
Sodium: 142 mmol/L (ref 134–144)
Total Protein: 6.8 g/dL (ref 6.0–8.5)

## 2020-07-15 LAB — SPECIMEN STATUS REPORT

## 2020-07-15 LAB — HGB A1C W/O EAG: Hgb A1c MFr Bld: 7 % — ABNORMAL HIGH (ref 4.8–5.6)

## 2020-07-16 LAB — NOVEL CORONAVIRUS, NAA: SARS-CoV-2, NAA: NOT DETECTED

## 2020-07-16 LAB — SARS-COV-2, NAA 2 DAY TAT

## 2020-07-18 DIAGNOSIS — Z794 Long term (current) use of insulin: Secondary | ICD-10-CM | POA: Diagnosis not present

## 2020-07-18 DIAGNOSIS — E109 Type 1 diabetes mellitus without complications: Secondary | ICD-10-CM | POA: Diagnosis not present

## 2020-07-28 DIAGNOSIS — E109 Type 1 diabetes mellitus without complications: Secondary | ICD-10-CM | POA: Diagnosis not present

## 2020-07-28 DIAGNOSIS — Z794 Long term (current) use of insulin: Secondary | ICD-10-CM | POA: Diagnosis not present

## 2020-09-01 DIAGNOSIS — E109 Type 1 diabetes mellitus without complications: Secondary | ICD-10-CM | POA: Diagnosis not present

## 2020-09-01 DIAGNOSIS — Z794 Long term (current) use of insulin: Secondary | ICD-10-CM | POA: Diagnosis not present

## 2020-09-29 DIAGNOSIS — E109 Type 1 diabetes mellitus without complications: Secondary | ICD-10-CM | POA: Diagnosis not present

## 2020-09-29 DIAGNOSIS — Z794 Long term (current) use of insulin: Secondary | ICD-10-CM | POA: Diagnosis not present

## 2020-10-16 DIAGNOSIS — E113293 Type 2 diabetes mellitus with mild nonproliferative diabetic retinopathy without macular edema, bilateral: Secondary | ICD-10-CM | POA: Diagnosis not present

## 2020-10-30 DIAGNOSIS — E109 Type 1 diabetes mellitus without complications: Secondary | ICD-10-CM | POA: Diagnosis not present

## 2020-10-30 DIAGNOSIS — Z794 Long term (current) use of insulin: Secondary | ICD-10-CM | POA: Diagnosis not present

## 2020-11-10 ENCOUNTER — Telehealth: Payer: Self-pay | Admitting: Cardiovascular Disease

## 2020-11-10 NOTE — Telephone Encounter (Signed)
4.11.22 LM on cell phone to sch fu w/Dr Croitoru. LP

## 2020-11-11 DIAGNOSIS — E109 Type 1 diabetes mellitus without complications: Secondary | ICD-10-CM | POA: Diagnosis not present

## 2020-11-11 DIAGNOSIS — E7800 Pure hypercholesterolemia, unspecified: Secondary | ICD-10-CM | POA: Diagnosis not present

## 2020-11-15 ENCOUNTER — Other Ambulatory Visit: Payer: Self-pay | Admitting: Family Medicine

## 2020-11-15 DIAGNOSIS — E119 Type 2 diabetes mellitus without complications: Secondary | ICD-10-CM

## 2020-11-18 DIAGNOSIS — R809 Proteinuria, unspecified: Secondary | ICD-10-CM | POA: Diagnosis not present

## 2020-11-18 DIAGNOSIS — E7800 Pure hypercholesterolemia, unspecified: Secondary | ICD-10-CM | POA: Diagnosis not present

## 2020-11-18 DIAGNOSIS — E109 Type 1 diabetes mellitus without complications: Secondary | ICD-10-CM | POA: Diagnosis not present

## 2020-11-18 DIAGNOSIS — Z9641 Presence of insulin pump (external) (internal): Secondary | ICD-10-CM | POA: Diagnosis not present

## 2020-11-24 ENCOUNTER — Other Ambulatory Visit: Payer: Self-pay

## 2020-11-24 ENCOUNTER — Ambulatory Visit: Payer: BC Managed Care – PPO | Admitting: Family Medicine

## 2020-11-24 ENCOUNTER — Encounter: Payer: Self-pay | Admitting: Family Medicine

## 2020-11-24 VITALS — BP 110/68 | HR 85 | Temp 96.4°F | Ht 70.0 in | Wt 171.0 lb

## 2020-11-24 DIAGNOSIS — Z23 Encounter for immunization: Secondary | ICD-10-CM | POA: Diagnosis not present

## 2020-11-24 DIAGNOSIS — Z0000 Encounter for general adult medical examination without abnormal findings: Secondary | ICD-10-CM | POA: Diagnosis not present

## 2020-11-24 DIAGNOSIS — E1136 Type 2 diabetes mellitus with diabetic cataract: Secondary | ICD-10-CM

## 2020-11-24 DIAGNOSIS — K219 Gastro-esophageal reflux disease without esophagitis: Secondary | ICD-10-CM | POA: Diagnosis not present

## 2020-11-24 DIAGNOSIS — E119 Type 2 diabetes mellitus without complications: Secondary | ICD-10-CM

## 2020-11-24 DIAGNOSIS — I700 Atherosclerosis of aorta: Secondary | ICD-10-CM

## 2020-11-24 DIAGNOSIS — M79641 Pain in right hand: Secondary | ICD-10-CM

## 2020-11-24 DIAGNOSIS — Z6379 Other stressful life events affecting family and household: Secondary | ICD-10-CM

## 2020-11-24 NOTE — Progress Notes (Signed)
   Subjective:    Patient ID: Colin Roth, male    DOB: [birth year], 55 y.o.   MRN: [id number]  HPI He is here for complete examination.  He does have a concern over his right hand and does have a lesion present in in the same scars he has had a Dupuytren's contracture.  He does have insulin-dependent diabetes and is being followed by Dr. Talmage Nap and presently is using a Medtronic pump with continuous glucose monitoring.  This seems to be doing quite nicely.  His most recent hemoglobin A1c was 6.6.  His reflux seems to be under good control.  He does have a x-ray changes of atherosclerosis.  He does have cataracts and is being followed regularly by ophthalmology for that.  He has been under a lot of stress dealing with the fact that his wife is in the hospital again with oxygen dependent COPD.  His son also recently got out of jail and his home not working.  His work has been quite busy.  Otherwise family and social history as well as health maintenance and immunizations was reviewed.   Review of Systems  All other systems reviewed and are negative.      Objective:   Physical Exam Alert and in no distress. Tympanic membranes and canals are normal. Pharyngeal area is normal. Neck is supple without adenopathy or thyromegaly. Cardiac exam shows a regular sinus rhythm without murmurs or gallops. Lungs are clear to auscultation. Abdominal exam shows no masses or tenderness with normal bowel sounds.  Insulin pump is noted to be in place. Exam of the right hand does show a 1 cm raised almost pustular appearing lesion in the surgical scar.      Assessment & Plan:  Routine general medical examination at a health care facility  DM type 2, not at goal [facility])  Gastroesophageal reflux disease without esophagitis  Atherosclerosis of aorta (HCC)  Cataract associated with type 2 diabetes mellitus (HCC)  Need for vaccination against Streptococcus pneumoniae - Plan: Pneumococcal polysaccharide vaccine  23-valent greater than or equal to 2yo subcutaneous/IM  Stress due to illness of family member  Right hand pain Recommended he call the orthopedic surgeon about the lesion in his hands. He will continue on his present medication regimen, follow-up with ophthalmology and with endocrine. I then discussed the stress that he is under concerning the wife and child.  Strongly encouraged him to try to compartmentalize this and not be sucked into the drama of both of these situations.  He is to make sure that he gets plenty of help dealing with his wife and since his son is at home hopefully that will help take care of this.  Encouraged to get counseling if he needed them.  He was comfortable with all this.

## 2020-11-30 DIAGNOSIS — E109 Type 1 diabetes mellitus without complications: Secondary | ICD-10-CM | POA: Diagnosis not present

## 2020-11-30 DIAGNOSIS — Z794 Long term (current) use of insulin: Secondary | ICD-10-CM | POA: Diagnosis not present

## 2020-12-04 DIAGNOSIS — M720 Palmar fascial fibromatosis [Dupuytren]: Secondary | ICD-10-CM | POA: Diagnosis not present

## 2020-12-30 ENCOUNTER — Other Ambulatory Visit: Payer: Self-pay | Admitting: Family Medicine

## 2020-12-30 DIAGNOSIS — E119 Type 2 diabetes mellitus without complications: Secondary | ICD-10-CM

## 2020-12-31 DIAGNOSIS — Z794 Long term (current) use of insulin: Secondary | ICD-10-CM | POA: Diagnosis not present

## 2020-12-31 DIAGNOSIS — E109 Type 1 diabetes mellitus without complications: Secondary | ICD-10-CM | POA: Diagnosis not present

## 2021-03-28 ENCOUNTER — Other Ambulatory Visit: Payer: Self-pay | Admitting: Family Medicine

## 2021-03-28 DIAGNOSIS — E119 Type 2 diabetes mellitus without complications: Secondary | ICD-10-CM

## 2021-03-28 DIAGNOSIS — Z794 Long term (current) use of insulin: Secondary | ICD-10-CM

## 2021-06-01 ENCOUNTER — Ambulatory Visit (INDEPENDENT_AMBULATORY_CARE_PROVIDER_SITE_OTHER): Payer: Managed Care, Other (non HMO) | Admitting: Family Medicine

## 2021-06-01 ENCOUNTER — Encounter: Payer: Self-pay | Admitting: Family Medicine

## 2021-06-01 ENCOUNTER — Other Ambulatory Visit: Payer: Self-pay

## 2021-06-01 VITALS — BP 120/72 | HR 85 | Temp 97.9°F | Wt 173.0 lb

## 2021-06-01 DIAGNOSIS — E1136 Type 2 diabetes mellitus with diabetic cataract: Secondary | ICD-10-CM | POA: Diagnosis not present

## 2021-06-01 DIAGNOSIS — I700 Atherosclerosis of aorta: Secondary | ICD-10-CM

## 2021-06-01 DIAGNOSIS — K219 Gastro-esophageal reflux disease without esophagitis: Secondary | ICD-10-CM | POA: Diagnosis not present

## 2021-06-01 DIAGNOSIS — Z638 Other specified problems related to primary support group: Secondary | ICD-10-CM

## 2021-06-01 DIAGNOSIS — Z1159 Encounter for screening for other viral diseases: Secondary | ICD-10-CM

## 2021-06-01 DIAGNOSIS — E119 Type 2 diabetes mellitus without complications: Secondary | ICD-10-CM | POA: Diagnosis not present

## 2021-06-01 NOTE — Progress Notes (Signed)
   Subjective:    Patient ID: Colin Roth, male    DOB: 09-Jan-1966, 55 y.o.   MRN: [id number]  HPI He is here for a med check visit.  He does have underlying diabetes and is followed by Dr. Lisabeth Devoid for that.  He does have a pump and the reader but has had difficulty with insurance getting coverage for this.  His last A1c was around 8.1.  He does have reflux disease but is not having any difficulty with that now.  He continues on Lipitor and Cozaar without difficulty.  His main problem is dealing with his son.  He has a court case set up in the next day or so and has broken probation.  Colin Roth is tired of all this to the point that he wants to get his son out but the wife is fighting him and she has become very much an Scientist, forensic.   Review of Systems     Objective:   Physical Exam Alert and in no distress.  Cardiac exam shows regular rhythm without murmurs or gallops.  Lungs clear to auscultation.       Assessment & Plan:  DM type 2, not at goal [facility]) - Plan: CBC with Differential/Platelet, Comprehensive metabolic panel, Lipid panel  Gastroesophageal reflux disease without esophagitis  Cataract associated with type 2 diabetes mellitus (HCC)  Atherosclerosis of aorta (HCC) - Plan: Lipid panel  Need for hepatitis C screening test - Plan: Hepatitis C antibody  Stress due to family tension He will continue to be followed by Dr. Lisabeth Devoid and continue on his present medication regimen. Over 30 minutes spent discussing this and in particular the fact that his wife is acting as an Scientist, forensic.  He apparently has given her the option of either he goes or the son goes.  I conveyed to him that I totally understand this and I think they are on the right track to get the son out of the house ;they have burned the right to have peace and quiet.

## 2021-06-02 LAB — COMPREHENSIVE METABOLIC PANEL
ALT: 19 IU/L (ref 0–44)
AST: 19 IU/L (ref 0–40)
Albumin/Globulin Ratio: 1.8 (ref 1.2–2.2)
Albumin: 4.6 g/dL (ref 3.8–4.9)
Alkaline Phosphatase: 125 IU/L — ABNORMAL HIGH (ref 44–121)
BUN/Creatinine Ratio: 8 — ABNORMAL LOW (ref 9–20)
BUN: 7 mg/dL (ref 6–24)
Bilirubin Total: 0.6 mg/dL (ref 0.0–1.2)
CO2: 26 mmol/L (ref 20–29)
Calcium: 9.6 mg/dL (ref 8.7–10.2)
Chloride: 99 mmol/L (ref 96–106)
Creatinine, Ser: 0.9 mg/dL (ref 0.76–1.27)
Globulin, Total: 2.5 g/dL (ref 1.5–4.5)
Glucose: 173 mg/dL — ABNORMAL HIGH (ref 70–99)
Potassium: 5.7 mmol/L — ABNORMAL HIGH (ref 3.5–5.2)
Sodium: 137 mmol/L (ref 134–144)
Total Protein: 7.1 g/dL (ref 6.0–8.5)
eGFR: 101 mL/min/{1.73_m2} (ref >59)

## 2021-06-02 LAB — CBC WITH DIFFERENTIAL/PLATELET
Basophils Absolute: 0.1 10*3/uL (ref 0.0–0.2)
Basos: 1 %
EOS (ABSOLUTE): 0 10*3/uL (ref 0.0–0.4)
Eos: 0 %
Hematocrit: 45.7 % (ref 37.5–51.0)
Hemoglobin: 14.9 g/dL (ref 13.0–17.7)
Immature Grans (Abs): 0 10*3/uL (ref 0.0–0.1)
Immature Granulocytes: 0 %
Lymphocytes Absolute: 2 10*3/uL (ref 0.7–3.1)
Lymphs: 29 %
MCH: 29.9 pg (ref 26.6–33.0)
MCHC: 32.6 g/dL (ref 31.5–35.7)
MCV: 92 fL (ref 79–97)
Monocytes Absolute: 0.6 10*3/uL (ref 0.1–0.9)
Monocytes: 9 %
Neutrophils Absolute: 4.2 10*3/uL (ref 1.4–7.0)
Neutrophils: 61 %
Platelets: 312 10*3/uL (ref 150–450)
RBC: 4.98 x10E6/uL (ref 4.14–5.80)
RDW: 12.5 % (ref 11.6–15.4)
WBC: 7 10*3/uL (ref 3.4–10.8)

## 2021-06-02 LAB — LIPID PANEL
Chol/HDL Ratio: 2.4 ratio (ref 0.0–5.0)
Cholesterol, Total: 133 mg/dL (ref 100–199)
HDL: 55 mg/dL (ref >39)
LDL Chol Calc (NIH): 65 mg/dL (ref 0–99)
Triglycerides: 61 mg/dL (ref 0–149)
VLDL Cholesterol Cal: 13 mg/dL (ref 5–40)

## 2021-06-02 LAB — HEPATITIS C ANTIBODY: Hep C Virus Ab: <0.1 s/co ratio (ref 0.0–0.9)

## 2021-06-13 ENCOUNTER — Other Ambulatory Visit: Payer: Self-pay | Admitting: Family Medicine

## 2021-06-13 DIAGNOSIS — E119 Type 2 diabetes mellitus without complications: Secondary | ICD-10-CM

## 2021-09-12 ENCOUNTER — Other Ambulatory Visit: Payer: Self-pay | Admitting: Family Medicine

## 2021-09-12 DIAGNOSIS — E119 Type 2 diabetes mellitus without complications: Secondary | ICD-10-CM

## 2021-09-12 DIAGNOSIS — Z794 Long term (current) use of insulin: Secondary | ICD-10-CM

## 2021-11-30 ENCOUNTER — Encounter: Payer: Self-pay | Admitting: Family Medicine

## 2021-11-30 ENCOUNTER — Ambulatory Visit (INDEPENDENT_AMBULATORY_CARE_PROVIDER_SITE_OTHER): Payer: Managed Care, Other (non HMO) | Admitting: Family Medicine

## 2021-11-30 VITALS — BP 118/72 | HR 72 | Temp 97.7°F | Ht 72.0 in | Wt 185.0 lb

## 2021-11-30 DIAGNOSIS — Z794 Long term (current) use of insulin: Secondary | ICD-10-CM

## 2021-11-30 DIAGNOSIS — E1136 Type 2 diabetes mellitus with diabetic cataract: Secondary | ICD-10-CM

## 2021-11-30 DIAGNOSIS — I700 Atherosclerosis of aorta: Secondary | ICD-10-CM

## 2021-11-30 DIAGNOSIS — Z638 Other specified problems related to primary support group: Secondary | ICD-10-CM

## 2021-11-30 DIAGNOSIS — Z0000 Encounter for general adult medical examination without abnormal findings: Secondary | ICD-10-CM

## 2021-11-30 DIAGNOSIS — K219 Gastro-esophageal reflux disease without esophagitis: Secondary | ICD-10-CM

## 2021-11-30 DIAGNOSIS — Z23 Encounter for immunization: Secondary | ICD-10-CM | POA: Diagnosis not present

## 2021-11-30 DIAGNOSIS — E559 Vitamin D deficiency, unspecified: Secondary | ICD-10-CM

## 2021-11-30 DIAGNOSIS — E119 Type 2 diabetes mellitus without complications: Secondary | ICD-10-CM

## 2021-11-30 DIAGNOSIS — Z87442 Personal history of urinary calculi: Secondary | ICD-10-CM

## 2021-11-30 MED ORDER — LOSARTAN POTASSIUM 25 MG PO TABS: 25.0000 mg | ORAL_TABLET | Freq: Every day | ORAL | 3 refills | Status: DC

## 2021-11-30 MED ORDER — ATORVASTATIN CALCIUM 20 MG PO TABS: 20.0000 mg | ORAL_TABLET | Freq: Every day | ORAL | 3 refills | Status: DC

## 2021-11-30 NOTE — Progress Notes (Signed)
? ?  Subjective:  ? ? Patient ID: Colin Roth, male    DOB: 07-01-1966, 56 y.o.   MRN: [id number] ? ?HPI ?He is here for complete examination.  He is followed by Dr. Debbora Presto for his diabetes and now has a Medtronic pump and  sensor.  He is very comfortable with this and recognizes the need.  He also has a vitamin D deficiency and has been getting vitamin D supplementation.  He continues on Cozaar as well as Lipitor and is having no difficulty with that.  His home life seems to be slightly better.  They are still having some difficulty with their son and his behavior.  He does have drug and alcohol issues.  He does have a previous history of renal stones but none recently.  Otherwise his family and social history as well as health maintenance and immunizations was reviewed. ? ? ?Review of Systems  ?All other systems reviewed and are negative. ? ?   ?Objective:  ? Physical Exam ?Alert and in no distress. Tympanic membranes and canals are normal. Pharyngeal area is normal. Neck is supple without adenopathy or thyromegaly. Cardiac exam shows a regular sinus rhythm without murmurs or gallops. Lungs are clear to auscultation. ? ? ? ? ?   ?Assessment & Plan:  ?Routine general medical examination at a health care facility ? ?Atherosclerosis of aorta ([place]) ? ?Cataract associated with type 2 diabetes mellitus ([place]) ? ?Stress due to family tension ? ?Need for Tdap vaccination - Plan: Tdap vaccine greater than or equal to 7yo IM ? ?Type 2 diabetes mellitus treated with insulin ([place]) - Seizure in 2018 with documented blood sugar of 40 - Plan: losartan (COZAAR) 25 MG tablet, atorvastatin (LIPITOR) 20 MG tablet ? ?Vitamin D deficiency ? ?Gastroesophageal reflux disease without esophagitis ? ?History of renal stone ?He will continue on his present medication regimen.  Follow-up with ophthalmology as well as endocrinology.  Discussed home situation with him and he seems to be handling this fairly well.  I then discussed kidney  stones and strongly encouraged him to call me if he starts having symptoms and I will help with the pain medication. ? ?

## 2021-12-25 ENCOUNTER — Encounter: Payer: Self-pay | Admitting: Family Medicine

## 2022-03-17 LAB — COMPREHENSIVE METABOLIC PANEL: eGFR: >59

## 2022-03-17 LAB — BASIC METABOLIC PANEL WITH GFR: Creatinine: 1 (ref <=1.3)

## 2022-03-17 LAB — HEMOGLOBIN A1C: Hemoglobin A1C: 6.7

## 2022-04-07 ENCOUNTER — Encounter: Payer: Self-pay | Admitting: Internal Medicine

## 2022-04-20 ENCOUNTER — Ambulatory Visit
Admission: RE | Admit: 2022-04-20 | Discharge: 2022-04-20 | Disposition: A | Discharge status: Home / Self Care | Payer: Managed Care, Other (non HMO) | Source: Ambulatory Visit | Attending: Family Medicine | Admitting: Family Medicine

## 2022-04-20 ENCOUNTER — Encounter: Payer: Self-pay | Admitting: Family Medicine

## 2022-04-20 ENCOUNTER — Ambulatory Visit: Payer: Managed Care, Other (non HMO) | Admitting: Family Medicine

## 2022-04-20 VITALS — BP 100/70 | HR 76 | Temp 97.9°F | Wt 176.2 lb

## 2022-04-20 DIAGNOSIS — Z23 Encounter for immunization: Secondary | ICD-10-CM

## 2022-04-20 DIAGNOSIS — M533 Sacrococcygeal disorders, not elsewhere classified: Secondary | ICD-10-CM | POA: Diagnosis not present

## 2022-04-20 NOTE — Progress Notes (Signed)
   Subjective:    Patient ID: Colin Roth, male    DOB: 08/08/1965, 56 y.o.   MRN: [id number]  HPI He is here for evaluation of back pain.  He has a previous history of difficulty with back pain that he has been handling on his own however he did note some pain in his mid lower sacrococcygeal area.  In the past he has not had pain or swelling in that area.   Review of Systems     Objective:   Physical Exam Alert and in no distress.  Slight tenderness palpation over the mid coccygeal area.  The skin appears normal.  No SI joint tenderness.  Good hip motion.  Normal DTRs.       Assessment & Plan:  Coccygeal pain, acute - Plan: DG Sacrum/Coccyx  Need for influenza vaccination - Plan: Flu Vaccine QUAD 42mo+IM (Fluarix, Fluzone & Alfiuria Quad PF) I explained that I thought this was probably a normal variation but we will get an x-ray to prove this.  He was comfortable with that.  Recommend conservative care for this.

## 2022-05-11 ENCOUNTER — Encounter: Payer: Self-pay | Admitting: Internal Medicine

## 2022-05-24 ENCOUNTER — Encounter: Payer: Self-pay | Admitting: Internal Medicine

## 2022-07-02 ENCOUNTER — Encounter: Payer: Self-pay | Admitting: Cardiovascular Disease

## 2022-07-02 ENCOUNTER — Ambulatory Visit: Payer: Managed Care, Other (non HMO) | Attending: Cardiovascular Disease | Admitting: Cardiovascular Disease

## 2022-07-02 VITALS — BP 104/68 | HR 76 | Ht 72.0 in | Wt 182.0 lb

## 2022-07-02 DIAGNOSIS — I2510 Atherosclerotic heart disease of native coronary artery without angina pectoris: Secondary | ICD-10-CM

## 2022-07-02 DIAGNOSIS — E109 Type 1 diabetes mellitus without complications: Secondary | ICD-10-CM | POA: Diagnosis not present

## 2022-07-02 DIAGNOSIS — I700 Atherosclerosis of aorta: Secondary | ICD-10-CM

## 2022-07-02 DIAGNOSIS — E7800 Pure hypercholesterolemia, unspecified: Secondary | ICD-10-CM | POA: Diagnosis not present

## 2022-07-02 NOTE — Progress Notes (Signed)
Cardiology Office Note:    Date:  07/02/2022   ID:  Colin Roth, DOB [birth year], MRN [id number]  PCP:  Ronnald Nian, MD  Cardiologist:  Thurmon Fair, MD  Electrophysiologist:  None   Referring MD: Ronnald Nian, MD   Chief Complaint  Patient presents with   Coronary Artery Disease         History of Present Illness:    Colin Roth is a 56 y.o. male with a hx of insulin requiring type 1 diabetes mellitus since age 49, with recent complaints of chest discomfort.  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  During a stressful project he had problems with chest discomfort and shortness of breath.  He underwent an echocardiogram and an coronary CT angiogram, thankfully with relatively benign findings.  He has not had any further problems with chest pain or shortness of breath since his last appointment.  The echo was a normal study.  The coronary CT did show scattered coronary atherosclerosis with a calcium score of 208 (91st percentile).  In particular there was dense calcification at the bifurcation of the LAD-first diagonal branch.  However, none of the stenoses appeared to severe and by FFR analysis none of them were hemodynamically significant.  Colin Roth smoked approximately a pack a day for about 25 years but quit about 10 years ago.  His paternal grandmother had heart disease around the age of 3.  His cholesterol parameters are excellent and his hemoglobin A1c is 7.0%.  He has occasional hypoglycemia during more intense physical work.  He has occasional episodes of hypoglycemia during physical activity.  Few years ago he had a tonic clonic seizure related to severe nocturnal hypoglycemia after over correcting for a large meal during a party.  Colin Roth's wife Olegario Messier used to work in our office.  She is also my patient and is here for her an appointment today.  Past Medical History:  Diagnosis Date   Diabetes mellitus    type I    Former smoker    Migraine    Renal disorder    kidney stones   Seizure [facility])     Past Surgical History:  Procedure Laterality Date   JOINT REPLACEMENT     LEFT SHOULDER    Current Medications: Current Meds  Medication Sig   Accu-Chek FastClix Lancets MISC Apply topically.   acetaminophen (TYLENOL) 500 MG tablet Take by mouth.   atorvastatin (LIPITOR) 20 MG tablet Take 1 tablet (20 mg total) by mouth daily.   Cholecalciferol (VITAMIN D HIGH POTENCY) 25 MCG (1000 UT) capsule 1 capsule   Continuous Blood Gluc Sensor (GUARDIAN SENSOR 3) MISC See admin instructions.   CONTOUR NEXT TEST test strip    Docusate Sodium (DSS) 100 MG CAPS Take by mouth.   glucagon 1 MG injection Inject into the vein.   ibuprofen (ADVIL,MOTRIN) 200 MG tablet Take 600 mg by mouth every 6 (six) hours as needed.   losartan (COZAAR) 25 MG tablet Take 1 tablet (25 mg total) by mouth daily.   Multiple Vitamin (MULTI VITAMIN PO) Take by mouth.   NOVOLOG 100 UNIT/ML injection INJECT 50 UNITS SUBCUTANEOUSLY VIA PUMP     Allergies:   Other   Social History   Socioeconomic History   Marital status: Married    Spouse name: Olegario Messier   Number of children: 1   Years of education: 13   Highest education level: Not on file  Occupational History   Occupation:  Project Event organiser: [employer]    Comment: Audio/Visual  Tobacco Use   Smoking status: Former    Packs/day: 0.00    Types: Cigarettes    Quit date: 11/01/2010    Years since quitting: 11.6   Smokeless tobacco: Never  Substance and Sexual Activity   Alcohol use: Yes    Alcohol/week: 0.0 standard drinks of alcohol    Comment: 1-2 beers per day   Drug use: No   Sexual activity: Yes    Partners: Female    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   Lives at home with wife.   2 cups caffeine daily.   Social Determinants of Health    Financial Resource Strain: Not on file  Food Insecurity: Not on file  Transportation Needs: Not on file  Physical Activity: Not on file  Stress: Not on file  Social Connections: Not on file     Family History: The patient's family history includes Heart disease in his maternal grandfather; Hypertension in his paternal grandmother; Thyroid disease in his mother.  ROS:   Please see the history of present illness.     All other systems reviewed and are negative.  EKGs/Labs/Other Studies Reviewed:    The following studies were reviewed today:  Coronary CTA 11/05/2021  1. Coronary calcium score of 208. Dense LAD and first diagonal calcification at the bifurcation. This was 30 percentile for age and sex matched control.   2. Normal coronary origin with right dominance.   3. LAD is a large vessel that has dense mid calcified and non calcified plaque at the bifurcation of the first diagonal branch with mild to moderate luminal stenosis of 25-49%. There is dense calcified plaque at the proximal portion of the moderate sized diagonal branch. Stenosis may be flow limiting 50-74%. Will send for CT-FFR analysis.   4. Small proximal focal calcified circumflex plaque that is non flow limiting (0-24% stenosis).   4.  Aortic atherosclerosis.  CT-FFR 1. Left Main:0.97 normal 2. LAD: 0.97 proximal, 0.95 mid, 0.92 distal - normal 3. Diagonal branch: 0.96 proximal to mid - normal 4. LCX: 1.0 proximal, 0.97 mid, 0.94 distal - normal 5. RCA: 0.99 proximal, 0.97 mid, 0.93 distal - normal IMPRESSION: Normal FFR analysis. There is no flow limiting CAD. Both LAD and Diagonal lesion are non flow limiting. Recommend continued medical management, secondary prevention for non flow limiting CAD.  ECHO 10/22/2019   1. Left ventricular ejection fraction, by estimation, is 55 to 60%. The  left ventricle has normal function. The left ventricle has no regional  wall motion abnormalities. Left  ventricular diastolic parameters were  normal. The average left ventricular  global longitudinal strain is -22.5 %.   2. Right ventricular systolic function is normal. The right ventricular  size is normal. There is normal pulmonary artery systolic pressure.   3. The mitral valve is normal in structure. No evidence of mitral valve  regurgitation. No evidence of mitral stenosis.   4. The aortic valve is normal in structure. Aortic valve regurgitation is  not visualized. No aortic stenosis is present.    Labs from Dr. Willeen Cass office 09/17/2019 Total cholesterol 164, triglycerides 44, HDL 70, LDL 85 Normal liver function tests, BUN 15, creatinine 1.0, potassium 4.8, hemoglobin A1c 6.4%  EKG:  EKG is ordered today.  The ekg ordered today demonstrates NSR,  normal tracing  Recent Labs: 03/17/2022: Creatinine 1.0  Recent Lipid Panel    Component Value Date/Time   CHOL 133 06/01/2021 1057   TRIG 61 06/01/2021 1057   HDL 55 06/01/2021 1057   CHOLHDL 2.4 06/01/2021 1057   LDLCALC 65 06/01/2021 1057    Physical Exam:    VS:  BP 104/68 (BP Location: Left Arm, Patient Position: Sitting, Cuff Size: Normal)   Pulse 76   Ht 6' (1.829 m)   Wt 82.6 kg   SpO2 98%   BMI 24.68 kg/m     Wt Readings from Last 3 Encounters:  07/02/22 82.6 kg  04/20/22 79.9 kg  11/30/21 83.9 kg      General: Alert, oriented x3, no distress, slender and fit Head: no evidence of trauma, PERRL, EOMI, no exophtalmos or lid lag, no myxedema, no xanthelasma; normal ears, nose and oropharynx Neck: normal jugular venous pulsations and no hepatojugular reflux; brisk carotid pulses without delay and no carotid bruits Chest: clear to auscultation, no signs of consolidation by percussion or palpation, normal fremitus, symmetrical and full respiratory excursions Cardiovascular: normal position and quality of the apical impulse, regular rhythm, normal first and second heart sounds, no murmurs, rubs or gallops.  There is an  intermittent mid-systolic apical click Abdomen: no tenderness or distention, no masses by palpation, no abnormal pulsatility or arterial bruits, normal bowel sounds, no hepatosplenomegaly Extremities: no clubbing, cyanosis or edema; 2+ radial, ulnar and brachial pulses bilaterally; 2+ right femoral, posterior tibial and dorsalis pedis pulses; 2+ left femoral, posterior tibial and dorsalis pedis pulses; no subclavian or femoral bruits Neurological: grossly nonfocal Psych: Normal mood and affect   ASSESSMENT:    1. Coronary artery disease involving native coronary artery of native heart without angina pectoris   2. Atherosclerosis of aorta (HCC)   3. Hypercholesterolemia   4. Type 1 diabetes mellitus without complication (HCC)     PLAN:    In order of problems listed above:  CAD/aortic atherosclerosis: Colin Roth has substantially higher than average coronary plaque for his age, 8391st percentile by calcium score, but considering that he has had type 1 diabetes mellitus for over 15 years and has a remote history of smoking, the coronary anatomy is really not that bad.  Very important to focus on prevention of plaque progression.  His LDL is under 55.   Also has a great HDL.  He no longer smokes.  HLP: Continue current lipid lowering regimen. DM type I: Excellent glycemic control with insulin pump.  Has had fewer episodes of hypoglycemia. Chest pain: Currently still has some brief fleeting pains that are clearly musculoskeletal and not coronary.  The CT angiogram showed no hemodynamically significant stenoses. 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  There is no evidence of frank prolapse or mitral insufficiency.  The aortic valve has 3 leaflets.   Medication Adjustments/Labs and Tests Ordered: Current medicines are reviewed at length with the patient today.  Concerns regarding medicines are  outlined above.  Orders Placed This Encounter  Procedures   EKG 12-Lead   No orders of the defined types were placed in this encounter.   Patient Instructions  Medication Instructions:  Your physician recommends that you continue on your current medications as directed. Please refer to the Current Medication list given to you today.  *If you need a refill on your cardiac medications before your next appointment, please call your  pharmacy*  Lab Work: NONE ordered at this time of appointment   If you have labs (blood work) drawn today and your tests are completely normal, you will receive your results only by: MyChart Message (if you have MyChart) OR A paper copy in the mail If you have any lab test that is abnormal or we need to change your treatment, we will call you to review the results.  Testing/Procedures: NONE ordered at this time of appointment   Follow-Up: At [facility], you and your health needs are our priority.  As part of our continuing mission to provide you with exceptional heart care, we have created designated Provider Care Teams.  These Care Teams include your primary Cardiologist (physician) and Advanced Practice Providers (APPs -  Physician Assistants and Nurse Practitioners) who all work together to provide you with the care you need, when you need it.   Your next appointment:   As needed- if you develop new, worsen symptoms or fail to improve   The format for your next appointment:   In Person  Provider:   Thurmon Fair, MD     Other Instructions   Important Information About Sugar         Signed, Thurmon Fair, MD  07/02/2022 1:25 PM    [facility]

## 2022-07-02 NOTE — Patient Instructions (Signed)
Medication Instructions:  Your physician recommends that you continue on your current medications as directed. Please refer to the Current Medication list given to you today.  *If you need a refill on your cardiac medications before your next appointment, please call your pharmacy*  Lab Work: NONE ordered at this time of appointment   If you have labs (blood work) drawn today and your tests are completely normal, you will receive your results only by: MyChart Message (if you have MyChart) OR A paper copy in the mail If you have any lab test that is abnormal or we need to change your treatment, we will call you to review the results.  Testing/Procedures: NONE ordered at this time of appointment   Follow-Up: At [facility], you and your health needs are our priority.  As part of our continuing mission to provide you with exceptional heart care, we have created designated Provider Care Teams.  These Care Teams include your primary Cardiologist (physician) and Advanced Practice Providers (APPs -  Physician Assistants and Nurse Practitioners) who all work together to provide you with the care you need, when you need it.   Your next appointment:   As needed- if you develop new, worsen symptoms or fail to improve   The format for your next appointment:   In Person  Provider:   Thurmon Fair, MD     Other Instructions   Important Information About Sugar

## 2022-07-21 ENCOUNTER — Encounter: Payer: Self-pay | Admitting: Family Medicine

## 2022-07-21 ENCOUNTER — Telehealth: Payer: Managed Care, Other (non HMO) | Admitting: Family Medicine

## 2022-07-21 VITALS — Temp 98.3°F

## 2022-07-21 DIAGNOSIS — U071 COVID-19: Secondary | ICD-10-CM

## 2022-07-21 NOTE — Progress Notes (Signed)
   Subjective:    Patient ID: Colin Roth, male    DOB: 09/19/1965, 56 y.o.   MRN: [id number]  HPI Documentation for virtual audio and video telecommunications through Caregility encounter: The patient was located at home. 2 patient identifiers used.  The provider was located in the office. The patient did consent to this visit and is aware of possible charges through their insurance for this visit. The other persons participating in this telemedicine service were none. Time spent on call was 5 minutes and in review of previous records >20 minutes total for counseling and coordination of care. This virtual service is not related to other E/M service within previous 7 days.  He states that on Monday he noted a slight sore throat and did test for COVID which was negative however the next day the symptoms started to get worse.  Today he is having sore throat, fatigue, headache, myalgias, slight cough and congestion and the test was positive.  Review of Systems     Objective:   Physical Exam Alert and in no distress and appears nontoxic       Assessment & Plan:  COVID-19 I explained that he has a 5-day window to determine whether he needs any intervention other than supportive care.  Recommend Tylenol, decongestants as needed, cough meds.  If he develops a fever, worsening cough and shortness of breath, we will certainly get more aggressive otherwise he is to use supportive care and as long as he is improving by Saturday then he can use a mask after that for another 5 days.  He expressed understanding of this and will call if he has any difficulties.

## 2022-09-23 ENCOUNTER — Other Ambulatory Visit (HOSPITAL_COMMUNITY): Payer: Self-pay | Admitting: Endocrinology

## 2022-09-23 DIAGNOSIS — E0590 Thyrotoxicosis, unspecified without thyrotoxic crisis or storm: Secondary | ICD-10-CM

## 2022-09-24 ENCOUNTER — Encounter: Payer: Self-pay | Admitting: Family Medicine

## 2022-10-22 LAB — HM DIABETES EYE EXAM

## 2022-10-25 ENCOUNTER — Encounter (HOSPITAL_COMMUNITY)
Admission: RE | Admit: 2022-10-25 | Discharge: 2022-10-25 | Disposition: A | Discharge status: Home / Self Care | Payer: Managed Care, Other (non HMO) | Source: Ambulatory Visit | Attending: Endocrinology | Admitting: Endocrinology

## 2022-10-25 DIAGNOSIS — E0590 Thyrotoxicosis, unspecified without thyrotoxic crisis or storm: Secondary | ICD-10-CM | POA: Diagnosis not present

## 2022-10-25 MED ORDER — SODIUM IODIDE I-123 7.4 MBQ CAPS
453.0000 | ORAL_CAPSULE | Freq: Once | ORAL | Status: AC
  Administered 2022-10-25: 453 via ORAL

## 2022-10-26 ENCOUNTER — Encounter (HOSPITAL_COMMUNITY)
Admission: RE | Admit: 2022-10-26 | Discharge: 2022-10-26 | Disposition: A | Discharge status: Home / Self Care | Payer: Managed Care, Other (non HMO) | Source: Ambulatory Visit | Attending: Endocrinology | Admitting: Endocrinology

## 2022-11-23 ENCOUNTER — Other Ambulatory Visit: Payer: Self-pay | Admitting: Family Medicine

## 2022-11-23 DIAGNOSIS — Z794 Long term (current) use of insulin: Secondary | ICD-10-CM

## 2022-12-03 ENCOUNTER — Encounter: Payer: Managed Care, Other (non HMO) | Admitting: Family Medicine

## 2022-12-06 DIAGNOSIS — E1159 Type 2 diabetes mellitus with other circulatory complications: Secondary | ICD-10-CM | POA: Insufficient documentation

## 2022-12-07 ENCOUNTER — Encounter: Payer: Self-pay | Admitting: Family Medicine

## 2022-12-07 ENCOUNTER — Ambulatory Visit (INDEPENDENT_AMBULATORY_CARE_PROVIDER_SITE_OTHER): Payer: Managed Care, Other (non HMO) | Admitting: Family Medicine

## 2022-12-07 VITALS — BP 114/60 | HR 78 | Temp 98.0°F | Resp 16 | Ht 70.75 in | Wt 180.2 lb

## 2022-12-07 DIAGNOSIS — Z638 Other specified problems related to primary support group: Secondary | ICD-10-CM

## 2022-12-07 DIAGNOSIS — E119 Type 2 diabetes mellitus without complications: Secondary | ICD-10-CM

## 2022-12-07 DIAGNOSIS — E1169 Type 2 diabetes mellitus with other specified complication: Secondary | ICD-10-CM

## 2022-12-07 DIAGNOSIS — Z23 Encounter for immunization: Secondary | ICD-10-CM

## 2022-12-07 DIAGNOSIS — Z0000 Encounter for general adult medical examination without abnormal findings: Secondary | ICD-10-CM

## 2022-12-07 DIAGNOSIS — Z794 Long term (current) use of insulin: Secondary | ICD-10-CM

## 2022-12-07 DIAGNOSIS — I152 Hypertension secondary to endocrine disorders: Secondary | ICD-10-CM

## 2022-12-07 DIAGNOSIS — E1159 Type 2 diabetes mellitus with other circulatory complications: Secondary | ICD-10-CM

## 2022-12-07 DIAGNOSIS — K219 Gastro-esophageal reflux disease without esophagitis: Secondary | ICD-10-CM | POA: Diagnosis not present

## 2022-12-07 DIAGNOSIS — I700 Atherosclerosis of aorta: Secondary | ICD-10-CM | POA: Diagnosis not present

## 2022-12-07 DIAGNOSIS — Z1211 Encounter for screening for malignant neoplasm of colon: Secondary | ICD-10-CM

## 2022-12-07 DIAGNOSIS — E785 Hyperlipidemia, unspecified: Secondary | ICD-10-CM

## 2022-12-07 DIAGNOSIS — E1136 Type 2 diabetes mellitus with diabetic cataract: Secondary | ICD-10-CM

## 2022-12-07 NOTE — Progress Notes (Signed)
Complete physical exam  Patient: Colin Roth   DOB: 17-Mar-1966   57 y.o. Male  MRN: [id number]  Subjective:    Chief Complaint  Patient presents with   Annual Exam    No additional concerns.     Colin Roth is a 57 y.o. male who presents today for a complete physical exam. He reports consuming a general diet. Home exercise routine includes hiking. He generally feels well. He reports sleeping fairly well. He has insulin-dependent diabetes and is followed by Dr. Lisabeth Devoid for this.  He does have a pump and he also does carb counting.  His last hemoglobin A1c was 6.5.  He continues on Lipitor as well as Cozaar and over-the-counter medications.  His wife has medical issues which she feels the need to help take care of her.  He does have a son living with him which is causing some difficulty because of his drug and alcohol use.  His wife by his recollection seems to be an Scientist, forensic.  He has no other concerns or complaints.  Otherwise family and social history as well as health maintenance immunizations was reviewed  Most recent fall risk assessment:    12/07/2022    2:43 PM  Fall Risk   Falls in the past year? 0  Number falls in past yr: 0  Injury with Fall? 0  Risk for fall due to : History of fall(s)  Follow up Falls evaluation completed     Most recent depression screenings:    12/07/2022    2:43 PM 11/30/2021    8:31 AM  PHQ 2/9 Scores  PHQ - 2 Score 0 3  PHQ- 9 Score  11    Vision:Within last year and Dental: No regular dental care     Patient Care Team: Ronnald Nian, MD as PCP - General (Family Medicine) Croitoru, Rachelle Hora, MD as PCP - Cardiology (Cardiology) Dorisann Frames, MD as Consulting Physician (Endocrinology)   Outpatient Medications Prior to Visit  Medication Sig   Accu-Chek FastClix Lancets MISC Apply topically.   acetaminophen (TYLENOL) 500 MG tablet Take by mouth.   atorvastatin (LIPITOR) 20 MG tablet TAKE 1 TABLET BY MOUTH EVERY DAY   Cholecalciferol  (VITAMIN D HIGH POTENCY) 25 MCG (1000 UT) capsule 1 capsule   Continuous Blood Gluc Sensor (GUARDIAN SENSOR 3) MISC See admin instructions.   Docusate Sodium (DSS) 100 MG CAPS Take 300 mg by mouth.   glucagon 1 MG injection Inject into the vein. prn   HUMALOG 100 UNIT/ML injection 50 Units. Given via insulin pump   ibuprofen (ADVIL,MOTRIN) 200 MG tablet Take 600 mg by mouth every 6 (six) hours as needed.   losartan (COZAAR) 25 MG tablet TAKE 1 TABLET (25 MG TOTAL) BY MOUTH DAILY.   Multiple Vitamin (MULTI VITAMIN PO) Take by mouth.   [DISCONTINUED] CONTOUR NEXT TEST test strip    [DISCONTINUED] NOVOLOG 100 UNIT/ML injection INJECT 50 UNITS SUBCUTANEOUSLY VIA PUMP   Facility-Administered Medications Prior to Visit  Medication Dose Route Frequency Provider   gadopentetate dimeglumine (MAGNEVIST) injection 20 mL  20 mL Intravenous Once PRN Levert Feinstein, MD    Review of Systems  All other systems reviewed and are negative.         Objective:     BP 114/60   Pulse 78   Temp 98 F (36.7 C) (Oral)   Resp 16   Ht 5' 10.75" (1.797 m)   Wt 180 lb 3.2 oz (81.7 kg)   SpO2  98% Comment: room air  BMI 25.31 kg/m    Physical Exam  Alert and in no distress. Tympanic membranes and canals are normal. Pharyngeal area is normal. Neck is supple without adenopathy or thyromegaly. Cardiac exam shows a regular sinus rhythm without murmurs or gallops. Lungs are clear to auscultation.       Assessment & Plan:    Routine general medical examination at a health care facility  Atherosclerosis of aorta (HCC)  Gastroesophageal reflux disease without esophagitis  Type 2 diabetes mellitus treated with insulin (HCC) - Plan: CBC with Differential/Platelet  Cataract associated with type 2 diabetes mellitus (HCC)  Hypertension associated with diabetes (HCC)  Hyperlipidemia associated with type 2 diabetes mellitus (HCC) - Plan: Lipid panel  Stress due to family tension  Need for shingles  vaccine - Plan: Varicella-zoster vaccine IM  Screening for colon cancer - Plan: Cologuard  Immunization History  Administered Date(s) Administered   Influenza Split 04/26/2011, 05/08/2012   Influenza, Quadrivalent, Recombinant, Inj, Pf 06/06/2017, 05/03/2018, 05/07/2019, 05/14/2020, 05/22/2021   Influenza,inj,Quad PF,6+ Mos 04/20/2022   Influenza-Unspecified 04/24/2015, 06/03/2015, 05/31/2016, 05/22/2021   PFIZER Comirnaty(Gray Top)Covid-19 Tri-Sucrose Vaccine 09/22/2020   PFIZER(Purple Top)SARS-COV-2 Vaccination 10/07/2019, 11/06/2019   Pneumococcal Conjugate-13 11/19/2019   Pneumococcal Polysaccharide-23 11/24/2020   Tdap 05/08/2012, 11/30/2021   Zoster Recombinat (Shingrix) 12/07/2022    Health Maintenance  Topic Date Due   HIV Screening  Never done   Diabetic kidney evaluation - Urine ACR  Never done   OPHTHALMOLOGY EXAM  10/16/2021   FOOT EXAM  11/18/2021   COVID-19 Vaccine (4 - 2023-24 season) 04/02/2022   HEMOGLOBIN A1C  09/17/2022   Fecal DNA (Cologuard)  12/11/2022   Zoster Vaccines- Shingrix (2 of 2) 02/01/2023   INFLUENZA VACCINE  03/03/2023   Diabetic kidney evaluation - eGFR measurement  03/18/2023   DTaP/Tdap/Td (3 - Td or Tdap) 12/01/2031   Hepatitis C Screening  Completed   HPV VACCINES  Aged Out    Discussed health benefits of physical activity, and encouraged him to engage in regular exercise appropriate for his age and condition.  Problem List Items Addressed This Visit     Atherosclerosis of aorta (HCC)   Cataract associated with type 2 diabetes mellitus (HCC)   Relevant Medications   HUMALOG 100 UNIT/ML injection   GERD (gastroesophageal reflux disease)   Hypertension associated with diabetes (HCC)   Relevant Medications   HUMALOG 100 UNIT/ML injection   Type 2 diabetes mellitus treated with insulin (HCC)   Relevant Medications   HUMALOG 100 UNIT/ML injection   Other Relevant Orders   CBC with Differential/Platelet   Other Visit Diagnoses      Routine general medical examination at a health care facility    -  Primary   Hyperlipidemia associated with type 2 diabetes mellitus (HCC)       Relevant Medications   HUMALOG 100 UNIT/ML injection   Other Relevant Orders   Lipid panel   Stress due to family tension       Need for shingles vaccine       Relevant Orders   Varicella-zoster vaccine IM (Completed)   Screening for colon cancer       Relevant Orders   Cologuard      He will continue to be followed by endocrinology as well as cardiology.  He seems to doing quite nicely on that.  Will also recommend he go to Al-Anon to help deal with his son and I told him to tell his wife that  I want her to go as well.   Sharlot Gowda, MD

## 2022-12-08 LAB — CBC WITH DIFFERENTIAL/PLATELET
Basophils Absolute: 0.1 10*3/uL (ref 0.0–0.2)
Basos: 1 %
EOS (ABSOLUTE): 0.1 10*3/uL (ref 0.0–0.4)
Eos: 1 %
Hematocrit: 44.4 % (ref 37.5–51.0)
Hemoglobin: 14.5 g/dL (ref 13.0–17.7)
Immature Grans (Abs): 0 10*3/uL (ref 0.0–0.1)
Immature Granulocytes: 0 %
Lymphocytes Absolute: 1.4 10*3/uL (ref 0.7–3.1)
Lymphs: 22 %
MCH: 30.9 pg (ref 26.6–33.0)
MCHC: 32.7 g/dL (ref 31.5–35.7)
MCV: 95 fL (ref 79–97)
Monocytes Absolute: 0.6 10*3/uL (ref 0.1–0.9)
Monocytes: 9 %
Neutrophils Absolute: 4.4 10*3/uL (ref 1.4–7.0)
Neutrophils: 67 %
Platelets: 207 10*3/uL (ref 150–450)
RBC: 4.69 x10E6/uL (ref 4.14–5.80)
RDW: 12.6 % (ref 11.6–15.4)
WBC: 6.5 10*3/uL (ref 3.4–10.8)

## 2022-12-08 LAB — LIPID PANEL
Chol/HDL Ratio: 1.8 ratio (ref 0.0–5.0)
Cholesterol, Total: 130 mg/dL (ref 100–199)
HDL: 71 mg/dL (ref >39)
LDL Chol Calc (NIH): 48 mg/dL (ref 0–99)
Triglycerides: 44 mg/dL (ref 0–149)
VLDL Cholesterol Cal: 11 mg/dL (ref 5–40)

## 2022-12-10 ENCOUNTER — Encounter: Payer: Self-pay | Admitting: Internal Medicine

## 2022-12-27 LAB — COLOGUARD: COLOGUARD: NEGATIVE

## 2023-01-06 ENCOUNTER — Encounter: Payer: Self-pay | Admitting: Family Medicine

## 2023-01-06 ENCOUNTER — Ambulatory Visit: Payer: Managed Care, Other (non HMO) | Admitting: Family Medicine

## 2023-01-06 VITALS — BP 130/70 | HR 92 | Temp 98.4°F | Ht 71.0 in | Wt 181.2 lb

## 2023-01-06 DIAGNOSIS — R350 Frequency of micturition: Secondary | ICD-10-CM | POA: Diagnosis not present

## 2023-01-06 DIAGNOSIS — N3000 Acute cystitis without hematuria: Secondary | ICD-10-CM | POA: Diagnosis not present

## 2023-01-06 DIAGNOSIS — R300 Dysuria: Secondary | ICD-10-CM

## 2023-01-06 DIAGNOSIS — R8290 Unspecified abnormal findings in urine: Secondary | ICD-10-CM | POA: Diagnosis not present

## 2023-01-06 LAB — POCT URINALYSIS DIP (PROADVANTAGE DEVICE)
Bilirubin, UA: NEGATIVE
Glucose, UA: NEGATIVE mg/dL
Ketones, POC UA: NEGATIVE mg/dL
Nitrite, UA: NEGATIVE
Protein Ur, POC: NEGATIVE mg/dL
Specific Gravity, Urine: 1.01
Urobilinogen, Ur: 0.2
pH, UA: 6.5 (ref 5.0–8.0)

## 2023-01-06 MED ORDER — SULFAMETHOXAZOLE-TRIMETHOPRIM 800-160 MG PO TABS
1.0000 | ORAL_TABLET | Freq: Two times a day (BID) | ORAL | 0 refills | Status: DC
Start: 2023-01-06 — End: 2023-02-08

## 2023-01-06 NOTE — Patient Instructions (Addendum)
  Be sure to stay well hydrated. Take the antibiotics as directed. Be sure to protect your skin from the sun due to the potential sun sensitivity related to the antibiotic.  Contact us if you develop fever, chills, nausea, vomiting, flank pain or other worsening symptoms. If your symptoms don't resolve (especially if the culture didn't show a urinary tract infection), then you should return for re-evaluation, including a prostate exam.

## 2023-01-06 NOTE — Progress Notes (Signed)
Chief Complaint  Patient presents with   Burning with urination    Burning with urination Tues/Wed and had an odor. Was also cloudy. Today feels like urine [place] less cloudy and pain has subsided. He has frequency that was the most today.     6/4 he noticed an odor to his urine. Yesterday he noticed the urine was darker, cloudy. He had some slight burning with urination yesterday, none today. Increased urinary frequency today. Denies significant hesitancy, feels like he empties well. Stream is strong. Not sexually active with his wife, denies risk for STDs  Patient has DM (?if type 1 or 2, on insulin pump, under care of Dr. Talmage Nap). Sugars have remained good, no recent increase. No abdominal or flank pain, no fevers.   PMH, PSH, SH reviewed  Outpatient Encounter Medications as of 01/06/2023  Medication Sig Note   Accu-Chek FastClix Lancets MISC Apply topically.    atorvastatin (LIPITOR) 20 MG tablet TAKE 1 TABLET BY MOUTH EVERY DAY    Cholecalciferol (VITAMIN D HIGH POTENCY) 25 MCG (1000 UT) capsule 1 capsule    Continuous Blood Gluc Sensor (GUARDIAN SENSOR 3) MISC See admin instructions. 07/02/2022: Guardian Sensor 4   Docusate Sodium (DSS) 100 MG CAPS Take 300 mg by mouth.    HUMALOG 100 UNIT/ML injection 50 Units. Given via insulin pump    ibuprofen (ADVIL,MOTRIN) 200 MG tablet Take 600 mg by mouth every 6 (six) hours as needed. 01/06/2023: 400mg  this am   losartan (COZAAR) 25 MG tablet TAKE 1 TABLET (25 MG TOTAL) BY MOUTH DAILY.    Multiple Vitamin (MULTI VITAMIN PO) Take by mouth.    acetaminophen (TYLENOL) 500 MG tablet Take by mouth. (Patient not taking: Reported on 01/06/2023) 01/06/2023: As needed   glucagon 1 MG injection Inject into the vein. prn (Patient not taking: Reported on 01/06/2023) 01/06/2023: As needed   Facility-Administered Encounter Medications as of 01/06/2023  Medication   gadopentetate dimeglumine (MAGNEVIST) injection 20 mL   Allergies  Allergen Reactions   Other  Itching    Mouth itching  Egg plant   ROS: no fever, chills, n/v/d, abdominal or flank pain.  Urinary symptoms per HPI.   No chest pain, shortness of breath, rash or other concerns.    PHYSICAL EXAM:  BP 130/70   Pulse 92   Temp 98.4 F (36.9 C) (Tympanic)   Ht 5\' 11"  (1.803 m)   Wt 181 lb 3.2 oz (82.2 kg)   BMI 25.27 kg/m   Well-appearing, pleasant male in no distress HEENT: conjunctiva and sclera are clear, EOMI Neck: no lymphadenopathy or mass Heart: regular rate and rhythm Lungs: clear bilaterally Back: no CVA tenderness Abdomen: soft, nontender. No suprapubic tenderness. Insulin pump noted Extremities: no edema GU and rectal exam was declined Psych: normal mood, affect, hygiene and grooming Neuro: alert and oriented, normal gait, cranial nerves grossly intact.  Urine: cloudy, SG 1.010, moderate leuks  ASSESSMENT/PLAN:  Acute cystitis without hematuria - Ddx reviewed. Declined prostate exam. To return if sx don't completely resolve. Discussed potential dx prostatitis and difference in treatment - Plan: Urine Culture, sulfamethoxazole-trimethoprim (BACTRIM DS) 800-160 MG tablet  Urinary frequency - Plan: POCT Urinalysis DIP (Proadvantage Device)  Abnormal urine odor - Plan: POCT Urinalysis DIP (Proadvantage Device)  Burning with urination - Plan: POCT Urinalysis DIP (Proadvantage Device)   Recently had CPE. Hasn't had PSA or prostate exam.  Discussed screening for prostate cancer, and controversies. Discussed potential exam today, as male UTI's aren't common, potential for prostatitis,  which is treated differently and longer.  He prefers treatment for simple cystitis, and will return if symptoms don't resolve, if they recur, or other concerns. Risks/SE of meds reviewed.  S/sx for which he should seek f/u care reviewed. All questions answered.  He is leaving Saturday for [employer].  I spent 31 minutes dedicated to the care of this patient, including  pre-visit review of records, face to face time, post-visit ordering of testing and documentation.   Be sure to stay well hydrated. Take the antibiotics as directed. Be sure to protect your skin from the sun due to the potential sun sensitivity related to the antibiotic.  Contact us if you develop fever, chills, nausea, vomiting, flank pain or other worsening symptoms. If your symptoms don't resolve (especially if the culture didn't show a urinary tract infection), then you should return for re-evaluation, including a prostate exam.

## 2023-01-07 ENCOUNTER — Encounter: Payer: Self-pay | Admitting: Family Medicine

## 2023-01-11 LAB — URINE CULTURE

## 2023-01-30 ENCOUNTER — Encounter: Payer: Self-pay | Admitting: Family Medicine

## 2023-02-08 ENCOUNTER — Ambulatory Visit: Payer: Managed Care, Other (non HMO) | Admitting: Family Medicine

## 2023-02-08 ENCOUNTER — Encounter: Payer: Self-pay | Admitting: Family Medicine

## 2023-02-08 VITALS — BP 142/80 | HR 87 | Temp 98.1°F | Resp 16 | Wt 174.2 lb

## 2023-02-08 DIAGNOSIS — R300 Dysuria: Secondary | ICD-10-CM | POA: Diagnosis not present

## 2023-02-08 DIAGNOSIS — Z7189 Other specified counseling: Secondary | ICD-10-CM | POA: Diagnosis not present

## 2023-02-08 LAB — POCT URINALYSIS DIP (CLINITEK)
Bilirubin, UA: NEGATIVE
Blood, UA: NEGATIVE
Glucose, UA: NEGATIVE mg/dL
Ketones, POC UA: NEGATIVE mg/dL
Nitrite, UA: NEGATIVE
POC PROTEIN,UA: NEGATIVE
Spec Grav, UA: 1.01 (ref 1.010–1.025)
Urobilinogen, UA: 0.2 E.U./dL
pH, UA: 7.5 (ref 5.0–8.0)

## 2023-02-08 MED ORDER — DOXYCYCLINE HYCLATE 100 MG PO TABS
100.0000 mg | ORAL_TABLET | Freq: Two times a day (BID) | ORAL | 0 refills | Status: DC
Start: 2023-02-08 — End: 2023-08-30

## 2023-02-08 NOTE — Progress Notes (Signed)
   Subjective:    Patient ID: DACE BOSSIO, male    DOB: Dec 15, 1965, 57 y.o.   MRN: [id number]  HPI He is here for follow-up visit.  He was seen recently and treated for a UTI with Septra.  He continues to have difficulty with dysuria as well as feeling of incomplete emptying.  Culture did grow strep viridans.  No history of recent sexual activity. Also recently his wife died from a long illness.  He seems to be handling this fairly well.  It is definitely bittersweet.  He is also going to change the dynamic between he and his son   Review of Systems     Objective:    Physical Exam Alert and in no distress.  Genital exam shows normal penis and testes.  Urinalysis is recorded.       Assessment & Plan:  Since he is still symptomatic, will switch to a different antibiotic.  If he is still having symptoms in 3 weeks, checking again, I will refer to urology. I then discussed bereavement counseling and strongly suggested he go to hospice for this.  He has a fairly good attitude towards this think he will do well Problem List Items Addressed This Visit   None Visit Diagnoses     Dysuria    -  Primary   Relevant Medications   doxycycline (VIBRA-TABS) 100 MG tablet   Other Relevant Orders   POCT URINALYSIS DIP (CLINITEK) (Completed)   Urine Culture   Bereavement counseling

## 2023-02-11 LAB — URINE CULTURE

## 2023-02-12 LAB — URINE CULTURE

## 2023-03-03 ENCOUNTER — Encounter: Payer: Self-pay | Admitting: Family Medicine

## 2023-03-03 ENCOUNTER — Ambulatory Visit: Payer: Managed Care, Other (non HMO) | Admitting: Family Medicine

## 2023-03-03 VITALS — BP 112/66 | HR 72 | Temp 98.3°F | Wt 177.2 lb

## 2023-03-03 DIAGNOSIS — R300 Dysuria: Secondary | ICD-10-CM

## 2023-03-03 NOTE — Progress Notes (Signed)
   Subjective:    Patient ID: Colin Roth, male    DOB: 1965/11/13, 57 y.o.   MRN: [id number]  HPI He is here for recheck on his urinary symptoms.  He did take the antibiotic and after about 10 days he had no symptoms of any kind.  He is very happy with this.  On a side note his son is about to go before the courts and apparently will be in jail for about 7 months.  Nivek is in the process of after that cleaning house getting rid of things and potentially end up selling house and moving to something smaller.  I congratulated him on making these kinds of moves.  He is comfortable and is looking forward to having a new life ahead of him.   Review of Systems     Objective:    Physical Exam        Assessment & Plan:   Problem List Items Addressed This Visit   None

## 2023-03-30 ENCOUNTER — Other Ambulatory Visit: Payer: Self-pay | Admitting: Family Medicine

## 2023-03-30 DIAGNOSIS — E119 Type 2 diabetes mellitus without complications: Secondary | ICD-10-CM

## 2023-06-29 ENCOUNTER — Ambulatory Visit: Payer: Managed Care, Other (non HMO) | Admitting: Family Medicine

## 2023-06-29 ENCOUNTER — Encounter: Payer: Self-pay | Admitting: Family Medicine

## 2023-06-29 VITALS — BP 122/78 | HR 73 | Wt 181.6 lb

## 2023-06-29 DIAGNOSIS — Z23 Encounter for immunization: Secondary | ICD-10-CM | POA: Diagnosis not present

## 2023-06-29 DIAGNOSIS — F4321 Adjustment disorder with depressed mood: Secondary | ICD-10-CM

## 2023-06-29 MED ORDER — FLUOXETINE HCL 20 MG PO TABS
20.0000 mg | ORAL_TABLET | Freq: Every day | ORAL | 3 refills | Status: DC
Start: 2023-06-29 — End: 2024-06-12

## 2023-06-29 NOTE — Progress Notes (Signed)
   Subjective:    Patient ID: Colin Roth, male    DOB: [birth year], 57 y.o.   MRN: [id number]  HPI He is here for consultation concerning depression.  His wife died within the last several months.  His son has had drug problems and is now in a halfway house.  He is getting ready to go through the holiday season.  He is trying to get his house ready to he sold.   Review of Systems     Objective:    Physical Exam Alert and in no distress.  Dressed appropriately in slightly flat affect.       Assessment & Plan:  Situational depression - Plan: FLUoxetine (PROZAC) 20 MG tablet  Need for COVID-19 vaccine - Plan: Pfizer Comirnaty Covid -19 Vaccine 29yrs and older  Need for influenza vaccination - Plan: Flu Vaccine Trivalent High Dose (Fluad) I discussed him getting involved in counseling but right now is quite overwhelmed with his present situation and we will discuss this again in 1 month.  Will place him on Prozac.  Discussed possible side effects.

## 2023-07-28 ENCOUNTER — Encounter: Payer: Self-pay | Admitting: Family Medicine

## 2023-07-28 ENCOUNTER — Ambulatory Visit: Payer: Managed Care, Other (non HMO) | Admitting: Family Medicine

## 2023-07-28 VITALS — BP 118/78 | HR 76 | Ht 72.0 in | Wt 180.8 lb

## 2023-07-28 DIAGNOSIS — F4321 Adjustment disorder with depressed mood: Secondary | ICD-10-CM

## 2023-07-28 NOTE — Progress Notes (Signed)
   Subjective:    Patient ID: Colin Roth, male    DOB: 20-Oct-1965, 57 y.o.   MRN: [id number]  HPI He is here for a recheck.  He has been on Prozac for the last month and states that he is around 50% better.  Less anhedonia and more positive with life in general.  He is in the process of cleaning the house and getting rid of as much stuff as possible to make his life much more simple.   Review of Systems     Objective:    Physical Exam Alert and in no distress with appropriate affect.       Assessment & Plan:  Situational depression I congratulated him on the work that he has done and we will have him set up a virtual visit in 1 month to see how he is doing.  Explained to him that he may not realize how good he is actually supposed to feel since he has been dealing with the illness of his wife for about 12 years and his son for an even longer period of time.

## 2023-08-30 ENCOUNTER — Telehealth: Payer: Managed Care, Other (non HMO) | Admitting: Family Medicine

## 2023-08-30 ENCOUNTER — Encounter: Payer: Self-pay | Admitting: Family Medicine

## 2023-08-30 VITALS — Ht 72.0 in | Wt 180.0 lb

## 2023-08-30 DIAGNOSIS — F4321 Adjustment disorder with depressed mood: Secondary | ICD-10-CM | POA: Diagnosis not present

## 2023-08-30 MED ORDER — BUPROPION HCL ER (SR) 150 MG PO TB12
150.0000 mg | ORAL_TABLET | Freq: Two times a day (BID) | ORAL | 1 refills | Status: DC
Start: 2023-08-30 — End: 2023-10-05

## 2023-08-30 NOTE — Progress Notes (Signed)
   Subjective:    Patient ID: Colin Roth, male    DOB: 1966-04-18, 58 y.o.   MRN: [id number]  HPI Documentation for virtual audio and video telecommunications through Caregility encounter: The patient was located at home. 2 patient identifiers used.  The provider was located in the office. The patient did consent to this visit and is aware of possible charges through their insurance for this visit. The other persons participating in this telemedicine service were none. Time spent on call was 5 minutes and in review of previous records >15 minutes total for counseling and coordination of care.  This virtual service is not related to other E/M service within previous 7 days.  He continues on Prozac and states that he really has not noted much change.  Still thinking he is roughly 50% better.  He is doing his ADLs without difficulty and work is going well for him.  Review of Systems     Objective:    Physical Exam Alert and in no distress with appropriate affect       Assessment & Plan:  Situational depression - Plan: buPROPion (WELLBUTRIN SR) 150 MG 12 hr tablet After discussion with him, I will have him continue on Prozac and I will add Wellbutrin to see if this will help psychologically.  We we will touch bases again virtually in 1 month.

## 2023-08-31 ENCOUNTER — Telehealth: Payer: Self-pay | Admitting: Family Medicine

## 2023-08-31 NOTE — Telephone Encounter (Signed)
Pt has questions about Wellbutrin dosing, states you told him to take in morning but directions are for  Twice a day

## 2023-09-23 ENCOUNTER — Other Ambulatory Visit: Payer: Self-pay | Admitting: Family Medicine

## 2023-09-23 DIAGNOSIS — F4321 Adjustment disorder with depressed mood: Secondary | ICD-10-CM

## 2023-09-27 ENCOUNTER — Encounter: Payer: Self-pay | Admitting: Internal Medicine

## 2023-10-05 ENCOUNTER — Telehealth: Payer: Managed Care, Other (non HMO) | Admitting: Family Medicine

## 2023-10-05 ENCOUNTER — Encounter: Payer: Self-pay | Admitting: Family Medicine

## 2023-10-05 VITALS — Ht 72.0 in | Wt 180.0 lb

## 2023-10-05 DIAGNOSIS — F4321 Adjustment disorder with depressed mood: Secondary | ICD-10-CM

## 2023-10-05 MED ORDER — BUPROPION HCL ER (SR) 150 MG PO TB12: 150.0000 mg | ORAL_TABLET | Freq: Two times a day (BID) | ORAL | 1 refills | Status: DC

## 2023-10-05 NOTE — Progress Notes (Signed)
   Subjective:    Patient ID: Colin Roth, male    DOB: 1965/08/04, 58 y.o.   MRN: [id number]  HPI Documentation for virtual audio and video telecommunications through Caregility encounter: The patient was located at home. 2 patient identifiers used.  The provider was located in the office. The patient did consent to this visit and is aware of possible charges through their insurance for this visit. The other persons participating in this telemedicine service were none. Time spent on call was 5 minutes and in review of previous records >20 minutes total for counseling and coordination of care. This virtual service is not related to other E/M service within previous 7 days.  He is here for a recheck.  With his last visit I added Wellbutrin to his regimen.  He states that he can definitely feel much better and is much more animated and willing to accomplish things.  In the past he did admit to anhedonia.  He is happy with this.  His son just had a child so it does create some new dynamics but he is trying to maintain a distance.  Review of Systems     Objective:    Physical Exam Alert and in no distress with appropriate affect.       Assessment & Plan:  Situational depression - Plan: buPROPion (WELLBUTRIN SR) 150 MG 12 hr tablet He will continue on both medications and I will touch bases with him in approximately 2 months.  He was comfortable with that.

## 2023-11-13 ENCOUNTER — Other Ambulatory Visit: Payer: Self-pay | Admitting: Family Medicine

## 2023-11-13 DIAGNOSIS — F4321 Adjustment disorder with depressed mood: Secondary | ICD-10-CM

## 2023-12-14 ENCOUNTER — Ambulatory Visit: Payer: Managed Care, Other (non HMO) | Admitting: Family Medicine

## 2023-12-14 VITALS — BP 126/68 | HR 72 | Ht 72.0 in | Wt 181.4 lb

## 2023-12-14 DIAGNOSIS — I152 Hypertension secondary to endocrine disorders: Secondary | ICD-10-CM

## 2023-12-14 DIAGNOSIS — E1159 Type 2 diabetes mellitus with other circulatory complications: Secondary | ICD-10-CM | POA: Diagnosis not present

## 2023-12-14 DIAGNOSIS — Z87442 Personal history of urinary calculi: Secondary | ICD-10-CM | POA: Diagnosis not present

## 2023-12-14 DIAGNOSIS — E1136 Type 2 diabetes mellitus with diabetic cataract: Secondary | ICD-10-CM

## 2023-12-14 DIAGNOSIS — I700 Atherosclerosis of aorta: Secondary | ICD-10-CM

## 2023-12-14 DIAGNOSIS — Z794 Long term (current) use of insulin: Secondary | ICD-10-CM

## 2023-12-14 DIAGNOSIS — Z23 Encounter for immunization: Secondary | ICD-10-CM | POA: Diagnosis not present

## 2023-12-14 DIAGNOSIS — K219 Gastro-esophageal reflux disease without esophagitis: Secondary | ICD-10-CM

## 2023-12-14 DIAGNOSIS — E119 Type 2 diabetes mellitus without complications: Secondary | ICD-10-CM | POA: Diagnosis not present

## 2023-12-14 DIAGNOSIS — Z0000 Encounter for general adult medical examination without abnormal findings: Secondary | ICD-10-CM

## 2023-12-14 LAB — LIPID PANEL

## 2023-12-14 MED ORDER — LOSARTAN POTASSIUM 25 MG PO TABS: 25.0000 mg | ORAL_TABLET | Freq: Every day | ORAL | 3 refills | Status: AC

## 2023-12-14 MED ORDER — ATORVASTATIN CALCIUM 20 MG PO TABS: 20.0000 mg | ORAL_TABLET | Freq: Every day | ORAL | 3 refills | Status: AC

## 2023-12-14 NOTE — Progress Notes (Signed)
   Subjective:    Patient ID: WA RUMPF, male    DOB: 06-Jul-1966, 58 y.o.   MRN: [id number]  HPI He is here for complete examination.  His work is going well.  He does feel overwhelmed with dealing with trying to get his house ready to sell and getting rid of 30 years worth of memories.  He continues on Prozac  and Wellbutrin .  He does have underlying diabetes and is followed by Dr. Maurita Sow.  His last hemoglobin A1c was 6.6.  He is not having any more difficulty with his reflux.  He does have a previous history of renal stone approximately 15 years ago.  He does get yearly eye exams.   Review of Systems  All other systems reviewed and are negative.      Objective:    Physical Exam Alert and in no distress. Tympanic membranes and canals are normal. Pharyngeal area is normal. Neck is supple without adenopathy or thyromegaly. Cardiac exam shows a regular sinus rhythm without murmurs or gallops. Lungs are clear to auscultation.  Abdominal exam shows no masses or tenderness.        Assessment & Plan:  Routine general medical examination at a health care facility  Type 2 diabetes mellitus treated with insulin  (HCC) - Plan: CBC with Differential/Platelet, Comprehensive metabolic panel with GFR, Lipid panel, atorvastatin  (LIPITOR) 20 MG tablet, losartan  (COZAAR ) 25 MG tablet  Hypertension associated with diabetes (HCC) - Plan: CBC with Differential/Platelet, Comprehensive metabolic panel with GFR  History of renal stone  Gastroesophageal reflux disease without esophagitis  Cataract associated with type 2 diabetes mellitus (HCC)  Atherosclerosis of aorta (HCC) - Plan: Lipid panel  Need for shingles vaccine - Plan: Varicella-zoster vaccine IM  Type 2 diabetes mellitus treated with insulin  (HCC) - Seizure in 2018 with documented blood sugar of 40 - Plan: CBC with Differential/Platelet, Comprehensive metabolic panel with GFR, Lipid panel, atorvastatin  (LIPITOR) 20 MG tablet, losartan   (COZAAR ) 25 MG tablet Overall he does seem to be doing fairly well.  Did discuss the feeling of being overwhelmed and suggested taking 1 room at a time to go through rather than trying to tackle too much.

## 2023-12-15 ENCOUNTER — Ambulatory Visit: Payer: Self-pay | Admitting: Family Medicine

## 2023-12-15 LAB — CBC WITH DIFFERENTIAL/PLATELET
Basophils Absolute: 0 10*3/uL (ref 0.0–0.2)
Basos: 1 %
EOS (ABSOLUTE): 0.1 10*3/uL (ref 0.0–0.4)
Eos: 1 %
Hematocrit: 42.7 % (ref 37.5–51.0)
Hemoglobin: 13.9 g/dL (ref 13.0–17.7)
Immature Grans (Abs): 0 10*3/uL (ref 0.0–0.1)
Immature Granulocytes: 0 %
Lymphocytes Absolute: 1.3 10*3/uL (ref 0.7–3.1)
Lymphs: 20 %
MCH: 31 pg (ref 26.6–33.0)
MCHC: 32.6 g/dL (ref 31.5–35.7)
MCV: 95 fL (ref 79–97)
Monocytes Absolute: 0.8 10*3/uL (ref 0.1–0.9)
Monocytes: 12 %
Neutrophils Absolute: 4.2 10*3/uL (ref 1.4–7.0)
Neutrophils: 66 %
Platelets: 201 10*3/uL (ref 150–450)
RBC: 4.49 x10E6/uL (ref 4.14–5.80)
RDW: 12.5 % (ref 11.6–15.4)
WBC: 6.3 10*3/uL (ref 3.4–10.8)

## 2023-12-15 LAB — LIPID PANEL
Cholesterol, Total: 136 mg/dL (ref 100–199)
HDL: 74 mg/dL (ref >39)
LDL CALC COMMENT:: 1.8 ratio (ref 0.0–5.0)
LDL Chol Calc (NIH): 51 mg/dL (ref 0–99)
Triglycerides: 45 mg/dL (ref 0–149)
VLDL Cholesterol Cal: 11 mg/dL (ref 5–40)

## 2023-12-15 LAB — COMPREHENSIVE METABOLIC PANEL WITH GFR
ALT: 17 IU/L (ref 0–44)
AST: 20 IU/L (ref 0–40)
Albumin: 4.2 g/dL (ref 3.8–4.9)
Alkaline Phosphatase: 59 IU/L (ref 44–121)
BUN/Creatinine Ratio: 12 (ref 9–20)
BUN: 11 mg/dL (ref 6–24)
Bilirubin Total: 0.6 mg/dL (ref 0.0–1.2)
CO2: 21 mmol/L (ref 20–29)
Calcium: 8.7 mg/dL (ref 8.7–10.2)
Chloride: 101 mmol/L (ref 96–106)
Creatinine, Ser: 0.9 mg/dL (ref 0.76–1.27)
Globulin, Total: 2.3 g/dL (ref 1.5–4.5)
Glucose: 144 mg/dL — ABNORMAL HIGH (ref 70–99)
Potassium: 4.7 mmol/L (ref 3.5–5.2)
Sodium: 139 mmol/L (ref 134–144)
Total Protein: 6.5 g/dL (ref 6.0–8.5)
eGFR: 100 mL/min/{1.73_m2} (ref >59)

## 2023-12-18 ENCOUNTER — Other Ambulatory Visit: Payer: Self-pay | Admitting: Family Medicine

## 2023-12-18 DIAGNOSIS — F4321 Adjustment disorder with depressed mood: Secondary | ICD-10-CM

## 2024-01-11 ENCOUNTER — Other Ambulatory Visit: Payer: Self-pay | Admitting: Family Medicine

## 2024-01-11 DIAGNOSIS — F4321 Adjustment disorder with depressed mood: Secondary | ICD-10-CM

## 2024-06-12 ENCOUNTER — Other Ambulatory Visit: Payer: Self-pay | Admitting: Family Medicine

## 2024-06-12 DIAGNOSIS — F4321 Adjustment disorder with depressed mood: Secondary | ICD-10-CM

## 2024-07-07 ENCOUNTER — Other Ambulatory Visit: Payer: Self-pay | Admitting: Family Medicine

## 2024-07-07 DIAGNOSIS — F4321 Adjustment disorder with depressed mood: Secondary | ICD-10-CM

## 2024-07-18 ENCOUNTER — Ambulatory Visit: Admitting: Family Medicine

## 2024-07-18 ENCOUNTER — Encounter: Payer: Self-pay | Admitting: Family Medicine

## 2024-07-18 VITALS — BP 138/76 | HR 81 | Ht 72.0 in | Wt 188.6 lb

## 2024-07-18 DIAGNOSIS — Z794 Long term (current) use of insulin: Secondary | ICD-10-CM

## 2024-07-18 DIAGNOSIS — F4321 Adjustment disorder with depressed mood: Secondary | ICD-10-CM | POA: Diagnosis not present

## 2024-07-18 DIAGNOSIS — E119 Type 2 diabetes mellitus without complications: Secondary | ICD-10-CM

## 2024-07-18 DIAGNOSIS — Z23 Encounter for immunization: Secondary | ICD-10-CM

## 2024-07-18 NOTE — Progress Notes (Signed)
° °  Subjective:    Patient ID: Colin Roth, male    DOB: 03-10-1966, 58 y.o.   MRN: [id number]  HPI He is here for a recheck.  He is taking Prozac  and Wellbutrin  and states that he seems to doing quite well on this.  He is now slowly chipping away at taking care of things at home and has also expanded out into doing some things with her pathology and very much enjoying this.  He plans to have everybody over for Christmas.  He is also doing a lot more cooking for himself.  He has a very good feel for what needs to be done especially with his underlying diabetes.   Review of Systems     Objective:    Physical Exam  Alert and in no distress with appropriate affect      Assessment & Plan:  Need for influenza vaccination  Need for COVID-19 vaccine  Type 2 diabetes mellitus treated with insulin  (HCC)  Situational depression I congratulated him on the work that he has been doing and will continue him on his present medication regimen.  He has an appointment scheduled in May.

## 2024-12-14 ENCOUNTER — Encounter: Payer: Self-pay | Admitting: Family Medicine
